# Patient Record
Sex: Female | Born: 1965 | Race: Black or African American | Hispanic: No | Marital: Married | Smoking: Never smoker
Health system: Southern US, Community
[De-identification: ages and names within clinical notes are randomized; demographics above are authoritative.]

## PROBLEM LIST (undated history)

## (undated) DIAGNOSIS — R51 Headache: Secondary | ICD-10-CM

## (undated) DIAGNOSIS — K589 Irritable bowel syndrome without diarrhea: Secondary | ICD-10-CM

## (undated) HISTORY — PX: CHOLECYSTECTOMY: SHX55

---

## 2001-02-21 ENCOUNTER — Encounter: Payer: Self-pay | Admitting: Emergency Medicine

## 2001-02-21 ENCOUNTER — Encounter (INDEPENDENT_AMBULATORY_CARE_PROVIDER_SITE_OTHER): Payer: Self-pay

## 2001-02-22 ENCOUNTER — Inpatient Hospital Stay (HOSPITAL_COMMUNITY): Admission: RE | Admit: 2001-02-22 | Discharge: 2001-02-23 | Payer: Self-pay | Admitting: Emergency Medicine

## 2002-05-15 ENCOUNTER — Other Ambulatory Visit: Admission: RE | Admit: 2002-05-15 | Discharge: 2002-05-15 | Payer: Self-pay | Admitting: Obstetrics and Gynecology

## 2003-05-02 ENCOUNTER — Other Ambulatory Visit: Admission: RE | Admit: 2003-05-02 | Discharge: 2003-05-02 | Payer: Self-pay | Admitting: Obstetrics and Gynecology

## 2003-11-26 ENCOUNTER — Ambulatory Visit (HOSPITAL_COMMUNITY): Admission: RE | Admit: 2003-11-26 | Discharge: 2003-11-26 | Payer: Self-pay | Admitting: Obstetrics and Gynecology

## 2003-11-30 ENCOUNTER — Ambulatory Visit (HOSPITAL_COMMUNITY): Admission: RE | Admit: 2003-11-30 | Discharge: 2003-11-30 | Payer: Self-pay | Admitting: Obstetrics and Gynecology

## 2003-12-03 ENCOUNTER — Ambulatory Visit (HOSPITAL_COMMUNITY): Admission: RE | Admit: 2003-12-03 | Discharge: 2003-12-03 | Payer: Self-pay | Admitting: Obstetrics and Gynecology

## 2003-12-03 ENCOUNTER — Encounter (INDEPENDENT_AMBULATORY_CARE_PROVIDER_SITE_OTHER): Payer: Self-pay | Admitting: Specialist

## 2004-04-18 ENCOUNTER — Encounter: Admission: RE | Admit: 2004-04-18 | Discharge: 2004-04-18 | Payer: Self-pay | Admitting: Gastroenterology

## 2004-05-22 ENCOUNTER — Encounter (INDEPENDENT_AMBULATORY_CARE_PROVIDER_SITE_OTHER): Payer: Self-pay | Admitting: *Deleted

## 2004-05-22 ENCOUNTER — Ambulatory Visit (HOSPITAL_COMMUNITY): Admission: RE | Admit: 2004-05-22 | Discharge: 2004-05-22 | Payer: Self-pay | Admitting: Obstetrics and Gynecology

## 2004-08-26 ENCOUNTER — Other Ambulatory Visit: Admission: RE | Admit: 2004-08-26 | Discharge: 2004-08-26 | Payer: Self-pay | Admitting: Obstetrics and Gynecology

## 2004-12-30 ENCOUNTER — Ambulatory Visit (HOSPITAL_COMMUNITY): Admission: RE | Admit: 2004-12-30 | Discharge: 2004-12-30 | Payer: Self-pay | Admitting: Obstetrics and Gynecology

## 2005-02-03 ENCOUNTER — Encounter: Admission: RE | Admit: 2005-02-03 | Discharge: 2005-04-08 | Payer: Self-pay | Admitting: Obstetrics and Gynecology

## 2005-02-12 ENCOUNTER — Ambulatory Visit (HOSPITAL_COMMUNITY): Admission: RE | Admit: 2005-02-12 | Discharge: 2005-02-12 | Payer: Self-pay | Admitting: Obstetrics and Gynecology

## 2005-04-07 ENCOUNTER — Inpatient Hospital Stay (HOSPITAL_COMMUNITY): Admission: RE | Admit: 2005-04-07 | Discharge: 2005-04-10 | Payer: Self-pay | Admitting: Obstetrics and Gynecology

## 2005-09-23 ENCOUNTER — Other Ambulatory Visit: Admission: RE | Admit: 2005-09-23 | Discharge: 2005-09-23 | Payer: Self-pay | Admitting: Obstetrics and Gynecology

## 2007-12-27 ENCOUNTER — Encounter: Admission: RE | Admit: 2007-12-27 | Discharge: 2007-12-27 | Payer: Self-pay | Admitting: Obstetrics and Gynecology

## 2009-01-03 ENCOUNTER — Encounter: Admission: RE | Admit: 2009-01-03 | Discharge: 2009-01-03 | Payer: Self-pay | Admitting: Family Medicine

## 2010-01-22 ENCOUNTER — Encounter: Admission: RE | Admit: 2010-01-22 | Discharge: 2010-01-22 | Payer: Self-pay | Admitting: Family Medicine

## 2010-08-31 ENCOUNTER — Encounter: Payer: Self-pay | Admitting: Obstetrics and Gynecology

## 2010-10-31 ENCOUNTER — Other Ambulatory Visit: Payer: Self-pay | Admitting: Gastroenterology

## 2010-10-31 DIAGNOSIS — R634 Abnormal weight loss: Secondary | ICD-10-CM

## 2010-10-31 DIAGNOSIS — R197 Diarrhea, unspecified: Secondary | ICD-10-CM

## 2010-11-04 ENCOUNTER — Ambulatory Visit
Admission: RE | Admit: 2010-11-04 | Discharge: 2010-11-04 | Disposition: A | Discharge status: Home / Self Care | Payer: BC Managed Care – PPO | Source: Ambulatory Visit | Attending: Gastroenterology | Admitting: Gastroenterology

## 2010-11-04 DIAGNOSIS — R634 Abnormal weight loss: Secondary | ICD-10-CM

## 2010-11-04 DIAGNOSIS — R197 Diarrhea, unspecified: Secondary | ICD-10-CM

## 2010-11-04 MED ORDER — IOHEXOL 300 MG/ML  SOLN
125.0000 mL | Freq: Once | INTRAMUSCULAR | Status: AC | PRN
  Administered 2010-11-04: 125 mL via INTRAVENOUS

## 2010-12-26 NOTE — Op Note (Signed)
[name], BLAKLEY NO.:  [id number]   MEDICAL RECORD NO.:  [id number]                   PATIENT TYPE:  AMB   LOCATION:  SDC                                  [facility]ILITY:  [facility]   PHYSICIAN:  Janine Limbo, M.D.            DATE OF BIRTH:  05-22-1966   DATE OF PROCEDURE:  12/03/2003  DATE OF DISCHARGE:                                 OPERATIVE REPORT   PREOPERATIVE DIAGNOSIS:  First trimester missed abortion.   POSTOPERATIVE DIAGNOSIS:  First trimester missed abortion.   PROCEDURE:  Suction dilatation and evacuation.   SURGEON:  Janine Limbo, M.D.   ANESTHESIA:  Monitored anesthetic control and paracervical block.   DISPOSITION:  [name] is a 45 year old who presents with a missed  abortion in the first trimester, confirmed by ultrasound.  She understands  the indications for her procedure and she accepts the risks of, but not  limited to, anesthetic complications, bleeding, infections, and possible  damage to the surrounding organs.   FINDINGS:  A moderate amount of products of conception were removed from  within the uterine cavity.  The uterus sounded to 11 cm.  The patient's  blood type is B positive.   PROCEDURE:  The patient was taken to the operating room, where she was given  medication through her IV line.  The perineum and vagina were prepped with  multiple layers of Betadine.  The bladder was drained of urine.  The patient  was then sterilely draped after examination under anesthesia.  A  paracervical block was placed using 10 mL of 0.5% Marcaine with epinephrine.  The uterus was sounded to 11 cm.  The cervix was gradually dilated.  The  uterine cavity was then evacuated using a medium sharp curette and a #8  suction curette.  The cavity was felt to be clean at the end of our  procedure.  Hemostasis was adequate.  Examination under anesthesia was  repeated, and the uterus was noted to be firm.  Sponge, needle, and  instrument counts were correct.  The estimated blood loss was 25 mL.  The  patient tolerated her procedure well.  She was taken to the recovery room in  stable condition.   FOLLOW-UP INSTRUCTIONS:  The patient will take ibuprofen 600 mg every six  hours as needed for mild to moderate pain.  She will take Vicodin one to two  tablets every four hours as needed for severe pain.  She will return to see  Dr. Stefano Gaul in two to three weeks for follow-up examination.  She will  return to work in at least one week.  The patient was given a copy of the  postoperative instruction sheet as prepared by the [facility]  [facility] for patients who have undergone a dilatation and curettage.  Janine Limbo, M.D.    AVS/MEDQ  D:  12/03/2003  T:  12/03/2003  Job:  [id number]

## 2010-12-26 NOTE — Op Note (Signed)
[name], [name] NO.:  [id number]   MEDICAL RECORD NO.:  [id number]          PATIENT TYPE:  AMB   LOCATION:  SDC                           [facility]   PHYSICIAN:  Janine Limbo, M.D.DATE OF BIRTH:  May 27, 1966   DATE OF PROCEDURE:  05/22/2004  DATE OF DISCHARGE:                                 OPERATIVE REPORT   PREOPERATIVE DIAGNOSES:  1.  Pelvic pain.  2.  Diarrhea at the time of menses.  3.  Obesity (weight is 286 pounds, height 5 feet 7 inches).   POSTOPERATIVE DIAGNOSES:  1.  Pelvic pain.  2.  Diarrhea at the time of menses.  3.  Obesity (weight is 286 pounds, height 5 feet 7 inches).  4.  Fibroid uterus.  5.  Rule out endometriosis.   PROCEDURE:  1.  Diagnostic laparoscopy.  2.  Laparoscopic pelvic biopsies.   SURGEON:  Janine Limbo, M.D.   FIRST ASSISTANT:  None.   ANESTHESIA:  General.   DISPOSITION:  [name] is a 45 year old female, para 1-0-1-1, who presents  with the above mentioned diagnosis. She reports that her abdominal pain,  pelvic pain, and diarrhea are worse just prior to her menses. The patient  understands the indications for her procedure and she accepts the risk of,  but not limited, anesthetic complications, bleeding, infections and possible  damage to the surrounding organs.   FINDINGS:  The patient was found to have a 12 week size multifibroid uterus.  The fallopian tubes and the ovaries appeared normal bilaterally. There was  an area of hyperpigmentation and scarring in the left posterior cul-de-sac.  This was thought to be consistent with endometriosis. The remainder of the  cul-de-sac appeared normal. The appendix and the bowel appeared normal. We  were unable to visualize the upper abdomen because of scarring with the  omentum. The patient has previously had a cholecystectomy.   DESCRIPTION OF PROCEDURE:  The patient was taken to the operating room where  a general anesthetic was given. The  patient's abdomen, perineum and vagina  were prepped with multiple layers of Betadine. A Foley catheter was placed  in the bladder. Examination under anesthesia was performed. A Hulka  tenaculum was placed inside the uterus. The patient was sterilely draped.  The subumbilical area was injected with 5 mL of 0.5%  Marcaine with  epinephrine. An incision was made in the subumbilical area and extended  through the subcutaneous tissue, the fascia, and the anterior peritoneum.  Dissection was made difficult because of the patient's obesity. The Hasson  cannula was sutured into place using #0 Vicryl.  The patient was noted to  have omentum that was adhered to the anterior abdominal wall. Visualization  of the pelvis was very difficult because of an excessive amount of adipose  tissue in the pelvis. Two small incisions were made to the right of the  midline in the suprapubic area and two 5 mm trocars were placed under direct  visualization. The pelvic structures were carefully inspected. Findings are  mentioned above. There was an area of hyperpigmentation and scarring  in the  left posterior cul-de-sac and this area was biopsied away. Care was taken  not to damage any of the vital underlying structures.  Hemostasis was noted  to be adequate. There were no other areas felt to be consistent with  endometriosis visualized. The decision was then made to end the procedure.  The pneumoperitoneum was allowed to escape. Again there was no evidence of  bleeding or damage the bowel or vital structures. The fascia at the  subumbilical incision was then closed using figure-of-eight sutures of #0  Vicryl. The subcutaneous layer was closed using interrupted sutures. The  skin was reapproximated using a subcuticular suture of 4-0 Vicryl. The  suprapubic incisions were closed using subcuticular stitches of 4-0 Vicryl.  The patient tolerated the procedure well. She was awakened from her  anesthetic and taken to the  recovery room in stable condition. All  instruments were removed. Sponge, needle and instrument counts were correct.  The estimated blood loss was 10 mL.   FOLLOW UP:  The patient was given a prescription for Vicodin and she will  take 1-2 tablets every four hours as needed for pain. She will return to see  Dr. Stefano Gaul in 2-3 weeks for followup examination. She was given a copy of  the postoperative instruction sheet as prepared by the [facility]  [facility] for patients who have undergone laparoscopy.      AVS/MEDQ  D:  05/22/2004  T:  05/22/2004  Job:  [id number]   cc:   Petra Kuba, M.D.  [address]  [place]  [place] [address]  Fax: [phone]

## 2010-12-26 NOTE — H&P (Signed)
NAMEMELLISA, Bowen NO.:  [id number]   MEDICAL RECORD NO.:  [id number]          PATIENT TYPE:  AMB   LOCATION:  SDC                           [facility]   PHYSICIAN:  Janine Limbo, M.D.DATE OF BIRTH:  [birth year]   DATE OF ADMISSION:  DATE OF DISCHARGE:                                HISTORY & PHYSICAL   DATE OF SURGERY:  May 22, 2004   HISTORY OF PRESENT ILLNESS:  Ms. Bowen is a 45 year old female para 1-0-1-1  who presents for a diagnostic laparoscopy because of abdominal pain and  diarrhea.  She notes that this is particularly at the time of her menses.  An ultrasound was obtained that showed a 9.4 x 5.8 cm uterus.  The patient  was found to have multiple fibroids with the largest of which measuring 3.4  cm in size.  The endometrial thickness was 0.4 cm.  The ovaries appeared  normal.  The patient has had a negative urine culture.  She has had a  negative gonorrhea and negative chlamydia culture.  The patient's most  recent Pap smear was within normal limits.  The patient has had a previous  cholecystectomy.  She has been evaluated by a gastroenterologist and no  etiology has been found.   PAST MEDICAL HISTORY:  The patient had a cholecystectomy in 2000.  She had a  dilatation and curettage for a miscarriage in 2005.   DRUG ALLERGIES:  None known.   CURRENT MEDICATIONS:  Prenatal vitamins.   OBSTETRICAL HISTORY:  The patient has had a term vaginal delivery and one  first trimester miscarriage.   SOCIAL HISTORY:  The patient drinks alcohol socially.  She denies cigarette  use.  She denies recreational drug use.   FAMILY HISTORY:  Noncontributory.   REVIEW OF SYSTEMS:  See history of present illness.   PHYSICAL EXAMINATION:  VITAL SIGNS:  Weight is 286 pounds, height is 5 feet  7 inches.  HEENT:  Within normal limits.  CHEST:  Clear.  HEART:  Regular rate and rhythm.  BREASTS:  Without masses.  ABDOMEN:  Nontender and no masses are  appreciated.  EXTREMITIES:  Grossly normal.  NEUROLOGIC:  Grossly normal.  PELVIC:  External genitalia is normal, vagina is normal.  Cervix is  nontender.  Uterus is normal size, shape, and consistency.  Adnexa no  masses.   ASSESSMENT:  Abdominal pain with diarrhea, particularly at the time of  menses.  We must rule out endometriosis.   PLAN:  The patient will undergo a diagnostic laparoscopy.  She understands  the indications for her procedure and she accepts the risks of, but not  limited to, anesthetic complications, bleeding, infections, and possible  damage to the surrounding organs.      AVS/MEDQ  D:  05/16/2004  T:  05/16/2004  Job:  [id number]   cc:   Petra Kuba, M.D.  [address], [address]  [place]  [place] [address]  Fax: [phone]

## 2010-12-26 NOTE — Op Note (Signed)
[facility]  Patient:    [name], [name]                        MRN: [id number] Proc. Date: 02/21/01 Adm. Date:  [id number] Attending:  Vikki Ports.                           Operative Report  NO DICTATION DD:  02/21/01 TD:  02/22/01 Job: [id number]

## 2010-12-26 NOTE — Op Note (Signed)
[facility]  Patient:    [name], [name]                        MRN: [id number] Proc. Date: 02/21/01 Adm. Date:  [id number] Attending:  Vikki Ports CC:         Harrel Lemon. Merla Riches, M.D.   Operative Report  PREOPERATIVE DIAGNOSIS:  Acute cholelithiasis.  POSTOPERATIVE DIAGNOSIS:  Acute cholelithiasis.  OPERATION:  Laparoscopic cholecystectomy.  SURGEON:  Catalina Lunger, M.D.  ASSISTANT:  Sheppard Plumber. Earlene Plater, M.D.  ANESTHESIA:  General.  DESCRIPTION OF PROCEDURE:  The patient was taken to the operating room and placed in a supine position.  After adequate anesthesia was induced using endotracheal tube, the abdomen was prepped and draped in normal sterile fashion.  Using a transverse infraumbilical incision, I dissected down to the fascia.  The patient was quite obese, and it was a deep dissection.  The fascia was opened vertically.   The peritoneum was entered, and an 0 Vicryl pursestring suture was placed around the fascial defect.  Hasson trocar was placed in the abdomen, and the abdomen was insufflated to 15 mmHg using carbon dioxide.  Under direct visualization, a 10 mm port was placed in the subxiphoid region, and two 5 mm ports were placed in the right abdomen. Gallbladder was visualized.  It was very, very tense and, therefore, was aspirated using a ______ aspirator.  Dissection then began down at the fundus of the gallbladder until the cystic duct was easily identified.  It measured about 1.5 to 2 mm, was triply clipped and divided.  The cystic artery, however, was more complex.  There appeared to be an anterior, posterior, and a third branch.  These were dissected until they were seen to go clearly onto the gallbladder.  These were clipped individually and divided.  The gallbladder was removed off the gallbladder bed bed using Bovie electrocautery and removed through the umbilical port.  A second 0 Vicryl  figure-of-eight suture was used to close the fascial defect as there was an air leak.  The right upper quadrant was copiously irrigated.  Adequate hemostasis was ensured.  All incisions were injected using Marcaine, and the skin incisions were closed with subcuticular 4-0  Monocryl.  Steri-Strips and sterile dressings were applied.  The patient tolerated the procedure well and went to PACU in good condition. DD:  02/22/01 TD:  02/22/01 Job: [id number]

## 2010-12-26 NOTE — H&P (Signed)
[name], [name] NO.:  [id number]   MEDICAL RECORD NO.:  [id number]          PATIENT TYPE:  MAT   LOCATION:  MATC                          [facility]   PHYSICIAN:  Janine Limbo, M.D.DATE OF BIRTH:  [birth year]   DATE OF ADMISSION:  04/07/2005  DATE OF DISCHARGE:                                HISTORY & PHYSICAL   HISTORY OF PRESENT ILLNESS:  [name] is a 45 year old female, gravida 3,  para 1-0-1-1, who presents at [redacted] weeks gestation ([facility] April 06, 2005). The  patient has been followed at the [facility] division of  [facility]. This pregnancy has been complicated by  gestational diabetes. The patient's blood sugars have been well controlled  by diet and exercise. The patient also has a known history of large  fibroids. Her age is greater than 35 and she declined amniocentesis. The  patient is obese and her weight is 287 pounds.   OBSTETRICAL HISTORY:  In 1997, the patient had a vaginal delivery at term of  a 7 pound and 14 ounce female infant. The patient had a miscarriage in 2005  and had a dilatation and curettage.   ALLERGIES:  No known drug allergies.   PAST MEDICAL HISTORY:  The patient is obese as mentioned above. She has a  known history of fibroids. In addition, the patient was diagnosed with  endometriosis.   SOCIAL HISTORY:  The patient denied cigarette use, alcohol use, and  recreational drug use.   REVIEW OF SYSTEMS:  Normal pregnancy complaints.   FAMILY HISTORY:  Noncontributory.   PHYSICAL EXAMINATION:  VITAL SIGNS:  Weight is 287 pounds. Height is 5 feet  and 6 inches.  HEENT:  Within normal limits.  CHEST:  Clear.  HEART:  Regular rate and rhythm.  BREASTS:  Without masses.  ABDOMEN:  Gravid with a fundal height of 41 cm.  EXTREMITIES:  Within normal limits.  NEUROLOGICAL:  Grossly normal.  PELVIC:  Cervix is 3 cm dilated, 50% effaced and -3 in station.   LABORATORY DATA:  Blood type  is B positive. Antibody screen negative. Sickle  cell negative. VDRL nonreactive. Rubella positive. HBSAG negative. HIV  negative. Third trimester beta strep is negative. Third trimester gonorrhea  is negative. Third trimester Chlamydia is negative.   ASSESSMENT:  1.  Gestation 40 weeks.  2.  Gestational diabetes, well controlled with diet.  3.  Obesity.  4.  Age greater than 35.  5.  Large fibroid uterus.   PLAN:  The patient will be admitted to the hospital for induction of labor.      Janine Limbo, M.D.  Electronically Signed     AVS/MEDQ  D:  04/06/2005  T:  04/06/2005  Job:  [id number]

## 2010-12-26 NOTE — H&P (Signed)
NAME:  [name], CAPPELLI NO.:  [id number]   MEDICAL RECORD NO.:  [id number]                   PATIENT TYPE:  OUT   LOCATION:  ULT                                  [facility]   PHYSICIAN:  Janine Limbo, M.D.            DATE OF BIRTH:  Aug 21, 1965   DATE OF ADMISSION:  DATE OF DISCHARGE:                                HISTORY & PHYSICAL   HISTORY OF PRESENT ILLNESS:  [name] is a 45 year old female, who  presents with a first trimester miscarriage.  This was confirmed by serial  ultrasounds.  The patient also was found to have a positive __________titer  with IgM antibodies but no IgG antibodies  .She has had cramping and  bleeding she says for approximately 11 days.   PAST MEDICAL HISTORY:  1. The patient has been told that she had borderline diabetes.  2. She has had one of her wisdom teeth removed.  3. She also had a gallbladder removed in 2001.   ALLERGIES:  Drug allergies:  None known.   SOCIAL HISTORY:  The patient denies cigarette use, alcohol use, and  recreational drug use.   REVIEW OF SYSTEMS:  Noncontributory.   FAMILY HISTORY:  The patient's grandmother has hypertension and colon  cancer.   PHYSICAL EXAMINATION:  HEENT:  Within normal limits.  CHEST:  Clear.  HEART:  Regular rate and rhythm.  ABDOMEN:  Nontender.  EXTREMITIES:  Grossly normal.  NEUROLOGIC:  Grossly normal.  PELVIC:  External genitalia are normal.  Vagina is normal.  Cervix is  nontender.  Uterus is upper limits of normal size.  Adnexa:  No masses.   LABORATORY DATA:  Blood  type is B positive, RPR nonreactive, rubella  positive.  Antibody screen negative, sickle cell screen negative.  HIV  nonreactive.   ASSESSMENT:  First trimester miscarriage.   PLAN:  The patient will undergo a dilatation and evacuation.  She  understands the indications for her procedure and she accepts the risks of,  but not limited to, anesthetic complications, bleeding,  infections, and  possible damage to the surrounding organs.                                              Janine Limbo, M.D.   AVS/MEDQ  D:  12/02/2003  T:  12/02/2003  Job:  [id number]

## 2011-01-20 ENCOUNTER — Other Ambulatory Visit: Payer: Self-pay | Admitting: Family Medicine

## 2011-01-20 DIAGNOSIS — Z1231 Encounter for screening mammogram for malignant neoplasm of breast: Secondary | ICD-10-CM

## 2011-02-18 ENCOUNTER — Ambulatory Visit
Admission: RE | Admit: 2011-02-18 | Discharge: 2011-02-18 | Disposition: A | Discharge status: Home / Self Care | Payer: BC Managed Care – PPO | Source: Ambulatory Visit | Attending: Family Medicine | Admitting: Family Medicine

## 2011-02-18 DIAGNOSIS — Z1231 Encounter for screening mammogram for malignant neoplasm of breast: Secondary | ICD-10-CM

## 2011-07-10 ENCOUNTER — Other Ambulatory Visit: Payer: Self-pay | Admitting: Obstetrics and Gynecology

## 2011-07-14 ENCOUNTER — Encounter (HOSPITAL_COMMUNITY): Payer: Self-pay | Admitting: Pharmacist

## 2011-07-17 ENCOUNTER — Encounter (HOSPITAL_COMMUNITY): Payer: Self-pay

## 2011-07-17 ENCOUNTER — Encounter (HOSPITAL_COMMUNITY)
Admission: RE | Admit: 2011-07-17 | Discharge: 2011-07-17 | Disposition: A | Discharge status: Home / Self Care | Payer: BC Managed Care – PPO | Source: Ambulatory Visit | Attending: Obstetrics and Gynecology | Admitting: Obstetrics and Gynecology

## 2011-07-17 HISTORY — DX: Headache: R51

## 2011-07-17 HISTORY — DX: Irritable bowel syndrome, unspecified: K58.9

## 2011-07-17 LAB — BASIC METABOLIC PANEL
BUN: 12 mg/dL (ref 6–23)
CO2: 26 mEq/L (ref 19–32)
Glucose, Bld: 90 mg/dL (ref 70–99)
Potassium: 3.4 mEq/L — ABNORMAL LOW (ref 3.5–5.1)
Sodium: 138 mEq/L (ref 135–145)

## 2011-07-17 LAB — URINALYSIS, ROUTINE W REFLEX MICROSCOPIC
Leukocytes, UA: NEGATIVE
Specific Gravity, Urine: >1.03 — ABNORMAL HIGH (ref 1.005–1.030)
Urobilinogen, UA: 0.2 mg/dL (ref 0.0–1.0)
pH: 5.5 (ref 5.0–8.0)

## 2011-07-17 LAB — CBC
Hemoglobin: 12.8 g/dL (ref 12.0–15.0)
MCH: 29.2 pg (ref 26.0–34.0)
MCV: 88.6 fL (ref 78.0–100.0)
RBC: 4.38 MIL/uL (ref 3.87–5.11)

## 2011-07-17 LAB — SURGICAL PCR SCREEN: MRSA, PCR: NEGATIVE

## 2011-07-17 LAB — URINE MICROSCOPIC-ADD ON

## 2011-07-17 NOTE — Pre-Procedure Instructions (Signed)
Pt's home #- [id number]

## 2011-07-17 NOTE — Patient Instructions (Addendum)
YOUR PROCEDURE IS SCHEDULED ON:07/23/11  ENTER THROUGH THE MAIN ENTRANCE OF [facility] [id number] am USE DESK PHONE AND DIAL [id number] TO INFORM us OF YOUR ARRIVAL  CALL [phone] IF YOU HAVE ANY QUESTIONS OR PROBLEMS PRIOR TO YOUR ARRIVAL.  REMEMBER: DO NOT EAT OR DRINK AFTER MIDNIGHT : Wed night  SPECIAL INSTRUCTIONS:   YOU MAY BRUSH YOUR TEETH THE MORNING OF SURGERY   TAKE THESE MEDICINES THE DAY OF SURGERY WITH SIP OF WATER:none   DO NOT WEAR JEWELRY, EYE MAKEUP, LIPSTICK OR DARK FINGERNAIL POLISH DO NOT WEAR LOTIONS    YOU WILL NOT BE ALLOWED TO DRIVE YOURSELF HOME.  NAME OF DRIVER:spouse- Oswaldo Done

## 2011-07-22 MED ORDER — CEFAZOLIN SODIUM-DEXTROSE 2-3 GM-% IV SOLR
2.0000 g | INTRAVENOUS | Status: AC
  Administered 2011-07-23: 2 g via INTRAVENOUS
  Filled 2011-07-22: qty 50

## 2011-07-22 NOTE — H&P (Signed)
[name], [name] NO.:  [id number]  MEDICAL RECORD NO.:  [id number]  LOCATION:  PERIO                         [facility]ILITY:  [facility]  PHYSICIAN:  Janine Limbo, M.D.DATE OF BIRTH:  July 10, 1966  DATE OF ADMISSION:  07/09/2011 DATE OF DISCHARGE:  07/17/2011                             HISTORY & PHYSICAL   HISTORY OF PRESENT ILLNESS:  [name] is a 45 year old female, para 2- 0-1-2, who presents for a vaginal hysterectomy.  The patient has been followed at the [facility].  The patient complains of menorrhagia. The patient has been treated with a Mirena intrauterine device and this was not able to control her bleeding.  She also complains of dysmenorrhea.  An ultrasound was performed, which showed a 10.74 x 8.02 cm uterus.  Fibroids were noted with the largest fibroid measuring 4.76 cm.  DRUG ALLERGIES:  No known drug allergies.  PAST MEDICAL HISTORY:  The patient has a history of gestational diabetes.  She also has a history of obesity.  OBSTETRICAL HISTORY:  The patient has had 2 vaginal deliveries at term. She had 1 miscarriage in the first trimester followed by dilatation and curettage.  SOCIAL HISTORY:  The patient denies cigarette use, alcohol use, and recreational drug use.  REVIEW OF SYSTEMS:  The patient complains of fatigue and back pain that she believes is due to her fibroids.  FAMILY HISTORY:  Noncontributory.  PHYSICAL EXAMINATION:  VITAL SIGNS:  Height is 5 feet 6 inches, weight is 293 pounds. HEENT:  Within normal limits. CHEST:  Clear. HEART:  Regular rate and rhythm. BREASTS:  Her breasts are without masses. ABDOMEN:  Nontender. EXTREMITIES:  Grossly normal. NEUROLOGIC:  Grossly normal. PELVIC:  External genitalia is normal.  Vagina is normal.  Cervix is nontender.  The uterus is approximately 8-10 week size, but it is difficult to outline the uterus due to  the patient's obese abdomen. Adnexa, no masses are appreciated and rectovaginal exam confirms.  ASSESSMENT: 1. Fibroid uterus. 2. Menorrhagia. 3. Dysmenorrhea. 4. Obesity.  PLAN:  The patient will undergo a vaginal hysterectomy.  She understands the indications for surgical procedure and she accepts the risks of, but not limited to, anesthetic complications, bleeding, infections, and possible damage to the surrounding organs.     Janine Limbo, M.D.     AVS/MEDQ  D:  07/22/2011  T:  07/22/2011  Job:  [id number]

## 2011-07-23 ENCOUNTER — Encounter (HOSPITAL_COMMUNITY)
Admission: RE | Disposition: A | Discharge status: Home / Self Care | Payer: Self-pay | Source: Ambulatory Visit | Attending: Obstetrics and Gynecology

## 2011-07-23 ENCOUNTER — Ambulatory Visit (HOSPITAL_COMMUNITY): Payer: BC Managed Care – PPO | Admitting: Anesthesiology

## 2011-07-23 ENCOUNTER — Other Ambulatory Visit: Payer: Self-pay | Admitting: Obstetrics and Gynecology

## 2011-07-23 ENCOUNTER — Ambulatory Visit (HOSPITAL_COMMUNITY)
Admission: RE | Admit: 2011-07-23 | Discharge: 2011-07-24 | Disposition: A | Discharge status: Home / Self Care | Payer: BC Managed Care – PPO | Source: Ambulatory Visit | Attending: Obstetrics and Gynecology | Admitting: Obstetrics and Gynecology

## 2011-07-23 ENCOUNTER — Encounter (HOSPITAL_COMMUNITY): Payer: Self-pay | Admitting: *Deleted

## 2011-07-23 ENCOUNTER — Encounter (HOSPITAL_COMMUNITY): Payer: Self-pay | Admitting: Anesthesiology

## 2011-07-23 DIAGNOSIS — Z01818 Encounter for other preprocedural examination: Secondary | ICD-10-CM | POA: Insufficient documentation

## 2011-07-23 DIAGNOSIS — E669 Obesity, unspecified: Secondary | ICD-10-CM | POA: Insufficient documentation

## 2011-07-23 DIAGNOSIS — N946 Dysmenorrhea, unspecified: Secondary | ICD-10-CM | POA: Insufficient documentation

## 2011-07-23 DIAGNOSIS — Z01812 Encounter for preprocedural laboratory examination: Secondary | ICD-10-CM | POA: Insufficient documentation

## 2011-07-23 DIAGNOSIS — N920 Excessive and frequent menstruation with regular cycle: Secondary | ICD-10-CM | POA: Insufficient documentation

## 2011-07-23 DIAGNOSIS — D252 Subserosal leiomyoma of uterus: Secondary | ICD-10-CM | POA: Insufficient documentation

## 2011-07-23 DIAGNOSIS — N719 Inflammatory disease of uterus, unspecified: Secondary | ICD-10-CM

## 2011-07-23 DIAGNOSIS — D219 Benign neoplasm of connective and other soft tissue, unspecified: Secondary | ICD-10-CM

## 2011-07-23 DIAGNOSIS — D251 Intramural leiomyoma of uterus: Secondary | ICD-10-CM | POA: Insufficient documentation

## 2011-07-23 DIAGNOSIS — D250 Submucous leiomyoma of uterus: Secondary | ICD-10-CM | POA: Insufficient documentation

## 2011-07-23 DIAGNOSIS — D649 Anemia, unspecified: Secondary | ICD-10-CM | POA: Insufficient documentation

## 2011-07-23 HISTORY — PX: VAGINAL HYSTERECTOMY: SHX2639

## 2011-07-23 SURGERY — HYSTERECTOMY, VAGINAL
Anesthesia: General

## 2011-07-23 MED ORDER — GLYCOPYRROLATE 0.2 MG/ML IJ SOLN
INTRAMUSCULAR | Status: DC | PRN
  Administered 2011-07-23: .6 mg via INTRAVENOUS
  Administered 2011-07-23: 0.2 mg via INTRAVENOUS

## 2011-07-23 MED ORDER — HYDROMORPHONE HCL PF 1 MG/ML IJ SOLN
INTRAMUSCULAR | Status: AC
  Filled 2011-07-23: qty 1

## 2011-07-23 MED ORDER — KETOROLAC TROMETHAMINE 30 MG/ML IJ SOLN
INTRAMUSCULAR | Status: DC | PRN
  Administered 2011-07-23 (×2): 30 mg via INTRAMUSCULAR

## 2011-07-23 MED ORDER — IBUPROFEN 800 MG PO TABS
800.0000 mg | ORAL_TABLET | Freq: Three times a day (TID) | ORAL | Status: DC | PRN
  Administered 2011-07-24: 800 mg via ORAL
  Filled 2011-07-23: qty 1

## 2011-07-23 MED ORDER — ONDANSETRON HCL 4 MG/2ML IJ SOLN
INTRAMUSCULAR | Status: DC | PRN
  Administered 2011-07-23: 4 mg via INTRAVENOUS

## 2011-07-23 MED ORDER — PROPOFOL 10 MG/ML IV EMUL
INTRAVENOUS | Status: DC | PRN
  Administered 2011-07-23: 200 mg via INTRAVENOUS

## 2011-07-23 MED ORDER — MEPERIDINE HCL 25 MG/ML IJ SOLN: 6.2500 mg | INTRAMUSCULAR | Status: DC | PRN

## 2011-07-23 MED ORDER — ROCURONIUM BROMIDE 50 MG/5ML IV SOLN
INTRAVENOUS | Status: AC
  Filled 2011-07-23: qty 1

## 2011-07-23 MED ORDER — MIDAZOLAM HCL 2 MG/2ML IJ SOLN
INTRAMUSCULAR | Status: AC
  Filled 2011-07-23: qty 2

## 2011-07-23 MED ORDER — LIDOCAINE HCL (CARDIAC) 20 MG/ML IV SOLN
INTRAVENOUS | Status: AC
  Filled 2011-07-23: qty 5

## 2011-07-23 MED ORDER — NEOSTIGMINE METHYLSULFATE 1 MG/ML IJ SOLN
INTRAMUSCULAR | Status: DC | PRN
  Administered 2011-07-23: 4 mg via INTRAVENOUS

## 2011-07-23 MED ORDER — BUPIVACAINE-EPINEPHRINE 0.5% -1:200000 IJ SOLN
INTRAMUSCULAR | Status: DC | PRN
  Administered 2011-07-23: 20 mL

## 2011-07-23 MED ORDER — LACTATED RINGERS IV SOLN
INTRAVENOUS | Status: DC
  Administered 2011-07-23: 11:00:00 via INTRAVENOUS
  Administered 2011-07-23: 1000 mL via INTRAVENOUS
  Administered 2011-07-23: 12:00:00 via INTRAVENOUS

## 2011-07-23 MED ORDER — GLYCOPYRROLATE 0.2 MG/ML IJ SOLN
INTRAMUSCULAR | Status: AC
  Filled 2011-07-23: qty 1

## 2011-07-23 MED ORDER — MORPHINE SULFATE 10 MG/ML IJ SOLN
INTRAMUSCULAR | Status: AC
  Filled 2011-07-23: qty 1

## 2011-07-23 MED ORDER — HYOSCYAMINE SULFATE ER 0.375 MG PO TB12
0.3750 mg | ORAL_TABLET | Freq: Two times a day (BID) | ORAL | Status: DC | PRN
  Filled 2011-07-23: qty 1

## 2011-07-23 MED ORDER — DEXTROSE IN LACTATED RINGERS 5 % IV SOLN
INTRAVENOUS | Status: DC
  Administered 2011-07-23: 19:00:00 via INTRAVENOUS

## 2011-07-23 MED ORDER — FENTANYL CITRATE 0.05 MG/ML IJ SOLN
INTRAMUSCULAR | Status: DC | PRN
  Administered 2011-07-23 (×3): 50 ug via INTRAVENOUS
  Administered 2011-07-23: 100 ug via INTRAVENOUS

## 2011-07-23 MED ORDER — FENTANYL CITRATE 0.05 MG/ML IJ SOLN
INTRAMUSCULAR | Status: AC
  Filled 2011-07-23: qty 2

## 2011-07-23 MED ORDER — HYDROMORPHONE HCL PF 1 MG/ML IJ SOLN
INTRAMUSCULAR | Status: AC
  Administered 2011-07-23: 0.5 mg via INTRAVENOUS
  Filled 2011-07-23: qty 1

## 2011-07-23 MED ORDER — HYDROMORPHONE HCL PF 1 MG/ML IJ SOLN
0.2500 mg | INTRAMUSCULAR | Status: DC | PRN
  Administered 2011-07-23 (×3): 0.5 mg via INTRAVENOUS

## 2011-07-23 MED ORDER — DOXYCYCLINE HYCLATE 100 MG PO TABS
100.0000 mg | ORAL_TABLET | Freq: Two times a day (BID) | ORAL | Status: DC
  Administered 2011-07-23 – 2011-07-24 (×2): 100 mg via ORAL
  Filled 2011-07-23 (×4): qty 1

## 2011-07-23 MED ORDER — MIDAZOLAM HCL 5 MG/5ML IJ SOLN
INTRAMUSCULAR | Status: DC | PRN
  Administered 2011-07-23: 1 mg via INTRAVENOUS
  Administered 2011-07-23: 2 mg via INTRAVENOUS

## 2011-07-23 MED ORDER — LIDOCAINE HCL (CARDIAC) 20 MG/ML IV SOLN
INTRAVENOUS | Status: DC | PRN
  Administered 2011-07-23: 100 mg via INTRAVENOUS

## 2011-07-23 MED ORDER — HYDROMORPHONE HCL PF 1 MG/ML IJ SOLN
0.2000 mg | INTRAMUSCULAR | Status: DC | PRN
  Administered 2011-07-23: 1 mg via INTRAVENOUS
  Filled 2011-07-23: qty 1

## 2011-07-23 MED ORDER — ONDANSETRON HCL 4 MG PO TABS: 4.0000 mg | ORAL_TABLET | Freq: Four times a day (QID) | ORAL | Status: DC | PRN

## 2011-07-23 MED ORDER — ONDANSETRON HCL 4 MG/2ML IJ SOLN
INTRAMUSCULAR | Status: AC
  Filled 2011-07-23: qty 2

## 2011-07-23 MED ORDER — CEFAZOLIN SODIUM-DEXTROSE 2-3 GM-% IV SOLR
2.0000 g | Freq: Three times a day (TID) | INTRAVENOUS | Status: AC
  Administered 2011-07-23 – 2011-07-24 (×2): 2 g via INTRAVENOUS
  Filled 2011-07-23 (×2): qty 50

## 2011-07-23 MED ORDER — METOCLOPRAMIDE HCL 5 MG/ML IJ SOLN: 10.0000 mg | Freq: Once | INTRAMUSCULAR | Status: DC | PRN

## 2011-07-23 MED ORDER — MENTHOL 3 MG MT LOZG
1.0000 | LOZENGE | OROMUCOSAL | Status: DC | PRN
  Filled 2011-07-23: qty 9

## 2011-07-23 MED ORDER — DEXAMETHASONE SODIUM PHOSPHATE 10 MG/ML IJ SOLN
INTRAMUSCULAR | Status: DC | PRN
  Administered 2011-07-23: 10 mg via INTRAVENOUS

## 2011-07-23 MED ORDER — KETOROLAC TROMETHAMINE 30 MG/ML IJ SOLN
30.0000 mg | Freq: Four times a day (QID) | INTRAMUSCULAR | Status: DC
  Administered 2011-07-23 – 2011-07-24 (×2): 30 mg via INTRAVENOUS
  Filled 2011-07-23 (×2): qty 1

## 2011-07-23 MED ORDER — OXYCODONE-ACETAMINOPHEN 5-325 MG PO TABS
1.0000 | ORAL_TABLET | ORAL | Status: DC | PRN
  Administered 2011-07-24: 2 via ORAL
  Filled 2011-07-23: qty 2

## 2011-07-23 MED ORDER — DEXAMETHASONE SODIUM PHOSPHATE 10 MG/ML IJ SOLN
INTRAMUSCULAR | Status: AC
  Filled 2011-07-23: qty 1

## 2011-07-23 MED ORDER — MORPHINE SULFATE 10 MG/ML IJ SOLN
INTRAMUSCULAR | Status: DC | PRN
  Administered 2011-07-23 (×2): 2 mg via INTRAVENOUS

## 2011-07-23 MED ORDER — ONDANSETRON HCL 4 MG/2ML IJ SOLN: 4.0000 mg | Freq: Four times a day (QID) | INTRAMUSCULAR | Status: DC | PRN

## 2011-07-23 MED ORDER — NEOSTIGMINE METHYLSULFATE 1 MG/ML IJ SOLN
INTRAMUSCULAR | Status: AC
  Filled 2011-07-23: qty 10

## 2011-07-23 MED ORDER — ROCURONIUM BROMIDE 100 MG/10ML IV SOLN
INTRAVENOUS | Status: DC | PRN
  Administered 2011-07-23: 50 mg via INTRAVENOUS
  Administered 2011-07-23: 20 mg via INTRAVENOUS

## 2011-07-23 SURGICAL SUPPLY — 27 items
CANISTER SUCTION 2500CC (MISCELLANEOUS) ×2 IMPLANT
CLOTH BEACON ORANGE TIMEOUT ST (SAFETY) ×2 IMPLANT
CONT PATH 16OZ SNAP LID 3702 (MISCELLANEOUS) IMPLANT
DECANTER SPIKE VIAL GLASS SM (MISCELLANEOUS) IMPLANT
DRAPE PROXIMA HALF (DRAPES) ×2 IMPLANT
GLOVE BIOGEL PI IND STRL 6.5 (GLOVE) ×1 IMPLANT
GLOVE BIOGEL PI IND STRL 8.5 (GLOVE) ×1 IMPLANT
GLOVE BIOGEL PI INDICATOR 6.5 (GLOVE) ×1
GLOVE BIOGEL PI INDICATOR 8.5 (GLOVE) ×1
GLOVE ECLIPSE 8.0 STRL XLNG CF (GLOVE) ×4 IMPLANT
GOWN PREVENTION PLUS LG XLONG (DISPOSABLE) ×6 IMPLANT
GOWN STRL REIN XL XLG (GOWN DISPOSABLE) ×2 IMPLANT
NEEDLE HYPO 22GX1.5 SAFETY (NEEDLE) IMPLANT
NEEDLE MAYO .5 CIRCLE (NEEDLE) ×2 IMPLANT
NEEDLE SPNL 22GX3.5 QUINCKE BK (NEEDLE) IMPLANT
NS IRRIG 1000ML POUR BTL (IV SOLUTION) ×2 IMPLANT
PACK VAGINAL WOMENS (CUSTOM PROCEDURE TRAY) ×2 IMPLANT
SUT CHROMIC 2 0 TIES 18 (SUTURE) IMPLANT
SUT VIC AB 0 CT1 18XCR BRD8 (SUTURE) ×3 IMPLANT
SUT VIC AB 0 CT1 27 (SUTURE) ×1
SUT VIC AB 0 CT1 27XBRD ANBCTR (SUTURE) ×1 IMPLANT
SUT VIC AB 0 CT1 8-18 (SUTURE) ×3
SUT VICRYL 0 TIES 12 18 (SUTURE) ×2 IMPLANT
SYR TB 1ML 25GX5/8 (SYRINGE) ×2 IMPLANT
TOWEL OR 17X24 6PK STRL BLUE (TOWEL DISPOSABLE) ×4 IMPLANT
TRAY FOLEY CATH 14FR (SET/KITS/TRAYS/PACK) ×2 IMPLANT
WATER STERILE IRR 1000ML POUR (IV SOLUTION) ×2 IMPLANT

## 2011-07-23 NOTE — Transfer of Care (Signed)
Immediate Anesthesia Transfer of Care Note  Patient: [name]  Procedure(s) Performed:  HYSTERECTOMY VAGINAL  Patient Location: PACU  Anesthesia Type: General  Level of Consciousness: awake and sedated  Airway & Oxygen Therapy: Patient Spontanous Breathing and Patient connected to nasal cannula oxygen  Post-op Assessment: Report given to PACU RN and Post -op Vital signs reviewed and stable  Post vital signs: Reviewed and stable  Complications: No apparent anesthesia complications

## 2011-07-23 NOTE — Op Note (Signed)
OPERATIVE NOTE  [name]  DOB:    Aug 19, 1965  MRN:    [id number]  CSN:    [id number]  Date of Surgery:  07/23/2011  Preoperative Diagnosis:  Fibroid uterus  Menorrhagia  Dysmenorrhea  Obesity  Endometritis on endometrial biopsy  Postoperative Diagnosis:  Same  Procedure:  Vaginal hysterectomy  Uterine morcellation  Surgeon:  Leonard Schwartz, M.D.  Assistant:  Jaymes Graff M.D.  Anesthetic:  General  Disposition:  The patient is a 45 year old female, para 2-0-1-2, who presents with the above-mentioned diagnosis. She has had a Mirena intrauterine device that did not relieve her discomfort. Endometritis was questioned on her endometrial biopsy. She has been treated with antibiotics. She wishes to proceed with definitive therapy at this time. She understands the indications for surgical procedure and she accepts the risk of, but not limited to, anesthetic complications, bleeding, infections, and possible damage to the surrounding organs.  Findings:  The uterus was approximately 14 week size area notable fibroids were noted. The estimated weight was 455 g. The ovaries and the tubes appeared normal.  Procedure:  The patient was taken to the operating room where a general anesthetic was given. The patient's lower abdomen, perineum, and vagina were prepped with global layers of Betadine. A Foley catheter was placed in the bladder. The patient was sterilely draped. Examination under anesthesia was performed. The cervix was injected with 20 cc of half percent Marcaine with epinephrine. A circumferential incision was made around the cervix and the vaginal mucosa was advanced anteriorly and posteriorly. The anterior cul-de-sac and in the posterior cul-de-sac were entered. Alternating from right to left the uterosacral ligaments, paracervical tissues, parametrial tissues, and uterine arteries were clamped, cut, sutured, and tied securely. Her surgery was  confiscated by the patient obesity. Attempts were made to invert the uterus through the posterior colpotomy. These were unsuccessful. We then began to morcellate the uterus from the posterior surface. Multiple fibroids were removed sharply. Eventually we were able to invert the uterus through the cul-de-sac. The upper pedicles were clamped and cut. The uterus was removed from the operative field. Figure-of-eight sutures were used to obtain hemostasis. The pelvis was inspected and no pathology was appreciated. The sutures attached to the uterosacral ligaments were brought out through the vaginal angles and tied securely. A McCall culdoplasty suture was placed in the posterior cul-de-sac incorporating the uterosacral ligaments bilaterally and the posterior peritoneum. A final check for hemostasis was made and hemostasis was thought to be adequate. The vaginal cuff was closed using figure-of-eight sutures incorporating the anterior vaginal mucosa, the anterior peritoneum, posterior peritoneum, and the posterior vaginal mucosa. The McCall culdoplasty suture was tied securely and the apex of the vagina was noted to elevate into the midpelvis. Sponge, needle, and isthmic counts were correct on 2 occasions. The estimated blood loss for the procedure was 200 cc. The patient tolerated her procedure well. She was awakened from her anesthetic without difficulty and transported to the recovery room in stable condition. The morcellated uterus was sent to pathology.  Leonard Schwartz, M.D.

## 2011-07-23 NOTE — Addendum Note (Signed)
Addendum  created 07/23/11 1509 by Damond Borchers   Modules edited:Notes Section    

## 2011-07-23 NOTE — Anesthesia Procedure Notes (Signed)
Procedure Name: Intubation Date/Time: 07/23/2011 10:30 AM Performed by: Jantzen Pilger MARIE Pre-anesthesia Checklist: Patient identified, Patient being monitored, Emergency Drugs available, Timeout performed and Suction available Patient Re-evaluated:Patient Re-evaluated prior to inductionOxygen Delivery Method: Circle System Utilized Preoxygenation: Pre-oxygenation with 100% oxygen Intubation Type: IV induction Ventilation: Mask ventilation without difficulty Laryngoscope Size: Mac and 4 Grade View: Grade III Tube type: Oral Airway Equipment and Method: video-laryngoscopy Placement Confirmation: ETT inserted through vocal cords under direct vision,  breath sounds checked- equal and bilateral and positive ETCO2 Secured at: 22 cm Dental Injury: Teeth and Oropharynx as per pre-operative assessment

## 2011-07-23 NOTE — Addendum Note (Signed)
Addendum  created 07/23/11 1509 by Pat Patrick   Modules edited:Notes Section

## 2011-07-23 NOTE — H&P (Signed)
The patient was interviewed and examined today.  The previously documented history and physical examination was reviewed. There are no changes except that the endometrial biopsy returned showing benign endometrial cell and chronic endometritis (history of IUD use) . The operative procedure was reviewed. The risks and benefits were outlined again. The specific risks include, but are not limited to, anesthetic complications, bleeding, infections, and possible damage to the surrounding organs. The patient's questions were answered.  We are ready to proceed as outlined. The likelihood of the patient achieving the goals of this procedure is very likely. The patient was offered the option of canceling her surgery and rescheduling at a later time. She wants to proceed today. We will give the patient antibiotics.  Leonard Schwartz, M.D.

## 2011-07-23 NOTE — Anesthesia Postprocedure Evaluation (Signed)
  Anesthesia Post-op Note  Patient: [name]  Procedure(s) Performed:  HYSTERECTOMY VAGINAL  Patient Location: PACU  Anesthesia Type: General  Level of Consciousness: awake, alert  and oriented  Airway and Oxygen Therapy: Patient Spontanous Breathing  Post-op Pain: none  Post-op Assessment: Post-op Vital signs reviewed, Patient's Cardiovascular Status Stable, Respiratory Function Stable, Patent Airway, No signs of Nausea or vomiting and Pain level controlled  Post-op Vital Signs: Reviewed and stable  Complications: No apparent anesthesia complications

## 2011-07-23 NOTE — Anesthesia Postprocedure Evaluation (Signed)
  Anesthesia Post-op Note  Patient: [name]  Procedure(s) Performed:  HYSTERECTOMY VAGINAL  Patient Location: PACU and Women's Unit  Anesthesia Type: General  Level of Consciousness: awake, alert  and oriented  Airway and Oxygen Therapy: Patient Spontanous Breathing  Post-op Pain: none  Post-op Assessment: Post-op Vital signs reviewed and Patient's Cardiovascular Status Stable  Post-op Vital Signs: Reviewed and stable  Complications: No apparent anesthesia complications

## 2011-07-23 NOTE — Progress Notes (Signed)
Subjective: Patient reports doing well. Minimal pain.    Objective: I have reviewed patient's vital signs and intake and output.  General: alert and no distress Resp: clear to auscultation bilaterally Cardio: regular rate and rhythm, S1, S2 normal, no murmur, click, rub or gallop GI: soft, non-tender; bowel sounds normal; no masses,  no organomegaly Extremities: extremities normal, atraumatic, no cyanosis or edema Vaginal Bleeding: none   Assessment/Plan: Doing well S/P vaginal hysterectomy. Endometritis  Plan discharge in the morning. Will continue antibiotics because of the possibility of endometritis.  LOS: 0 days    [name] V 07/23/2011, 6:01 PM

## 2011-07-23 NOTE — Anesthesia Preprocedure Evaluation (Signed)
Anesthesia Evaluation  Patient identified by MRN, date of birth, ID band Patient awake    Reviewed: Allergy & Precautions, H&P , NPO status , Patient's Chart, lab work & pertinent test results  Airway Mallampati: III TM Distance: >3 FB Neck ROM: full    Dental No notable dental hx.    Pulmonary neg pulmonary ROS,    Pulmonary exam normal       Cardiovascular neg cardio ROS     Neuro/Psych  Headaches, Negative Psych ROS   GI/Hepatic   Endo/Other  Morbid obesity  Renal/GU negative Renal ROS  Genitourinary negative   Musculoskeletal   Abdominal Normal abdominal exam  (+)   Peds  Hematology   Anesthesia Other Findings   Reproductive/Obstetrics negative OB ROS                           Anesthesia Physical Anesthesia Plan  ASA: III  Anesthesia Plan: General ETT   Post-op Pain Management:    Induction:   Airway Management Planned:   Additional Equipment:   Intra-op Plan:   Post-operative Plan:   Informed Consent: I have reviewed the patients History and Physical, chart, labs and discussed the procedure including the risks, benefits and alternatives for the proposed anesthesia with the patient or authorized representative who has indicated his/her understanding and acceptance.   Dental Advisory Given  Plan Discussed with: Anesthesiologist, CRNA and Surgeon  Anesthesia Plan Comments:         Anesthesia Quick Evaluation

## 2011-07-24 LAB — CBC
HCT: 34.3 % — ABNORMAL LOW (ref 36.0–46.0)
Hemoglobin: 11.3 g/dL — ABNORMAL LOW (ref 12.0–15.0)
MCHC: 32.9 g/dL (ref 30.0–36.0)
RBC: 3.87 MIL/uL (ref 3.87–5.11)
WBC: 13.6 10*3/uL — ABNORMAL HIGH (ref 4.0–10.5)

## 2011-07-24 MED ORDER — METRONIDAZOLE 500 MG PO TABS: 500.0000 mg | ORAL_TABLET | Freq: Three times a day (TID) | ORAL | Status: AC

## 2011-07-24 MED ORDER — FERROUS SULFATE 325 (65 FE) MG PO TABS: 325.0000 mg | ORAL_TABLET | Freq: Two times a day (BID) | ORAL | Status: DC

## 2011-07-24 MED ORDER — DEXTROSE IN LACTATED RINGERS 5 % IV SOLN
INTRAVENOUS | Status: DC
  Administered 2011-07-24: 05:00:00 via INTRAVENOUS

## 2011-07-24 MED ORDER — TRAMADOL HCL 50 MG PO TABS: 50.0000 mg | ORAL_TABLET | Freq: Four times a day (QID) | ORAL | Status: AC | PRN

## 2011-07-24 MED ORDER — OXYCODONE-ACETAMINOPHEN 5-325 MG PO TABS: 1.0000 | ORAL_TABLET | ORAL | Status: AC | PRN

## 2011-07-24 MED ORDER — SULFAMETHOXAZOLE-TRIMETHOPRIM 800-160 MG PO TABS: 1.0000 | ORAL_TABLET | Freq: Two times a day (BID) | ORAL | Status: AC

## 2011-07-24 MED ORDER — IBUPROFEN 200 MG PO TABS: 800.0000 mg | ORAL_TABLET | Freq: Three times a day (TID) | ORAL | Status: AC | PRN

## 2011-07-24 NOTE — Progress Notes (Signed)
Subjective: Patient reports tolerating PO and no problems voiding.    Objective: I have reviewed patient's vital signs and labs.  General: alert and no distress Resp: clear to auscultation bilaterally Cardio: regular rate and rhythm, S1, S2 normal, no murmur, click, rub or gallop GI: soft, non-tender; bowel sounds normal; no masses,  no organomegaly Vaginal Bleeding: minimal  CBC    Component Value Date/Time   WBC 13.6* 07/24/2011 0515   RBC 3.87 07/24/2011 0515   HGB 11.3* 07/24/2011 0515   HCT 34.3* 07/24/2011 0515   PLT 295 07/24/2011 0515   MCV 88.6 07/24/2011 0515   MCH 29.2 07/24/2011 0515   MCHC 32.9 07/24/2011 0515   RDW 15.0 07/24/2011 0515      Assessment/Plan: Doing Well. Ready for discharge after breakfast. Anemia - mild.  LOS: 1 day    Manya Balash V 07/24/2011, 8:23 AM

## 2011-07-24 NOTE — Progress Notes (Signed)
1 Day Post-Op Procedure(s) (LRB): HYSTERECTOMY VAGINAL (N/A)  Subjective: Patient reports that pain is well managed.  Tolerating normal diet as tolerated  diet without difficulty. Single episode of nausea after being up, ambulating and returning to bed. Hasn't voided yet nor passed flatus. C/O throat irritation.   Objective: BP 128/76  Pulse 66  Temp(Src) 98.9 F (37.2 C) (Oral)  Resp 20  Ht 5\' 8"  (1.727 m)  Wt 133.358 kg (294 lb)  BMI 44.70 kg/m2  SpO2 100%  LMP 06/25/2011 Lungs: clear Heart: normal rate and rhythm Abdomen:soft and appropriately tender Extremities: Homans sign is negative, no sign of DVT I  Assessment: s/p Procedure(s): HYSTERECTOMY VAGINAL: stable and progressing well  Plan:  Throat lozenges Encourage ambulation Discharge home later today   LOS: 1 day    Vickie Buster, PA-C 07/24/2011, 7:13 AM

## 2011-07-27 ENCOUNTER — Encounter (HOSPITAL_COMMUNITY): Payer: Self-pay | Admitting: Obstetrics and Gynecology

## 2012-01-19 ENCOUNTER — Other Ambulatory Visit: Payer: Self-pay | Admitting: Family Medicine

## 2012-01-19 DIAGNOSIS — Z1231 Encounter for screening mammogram for malignant neoplasm of breast: Secondary | ICD-10-CM

## 2012-02-19 ENCOUNTER — Ambulatory Visit: Payer: BC Managed Care – PPO

## 2012-02-19 ENCOUNTER — Ambulatory Visit
Admission: RE | Admit: 2012-02-19 | Discharge: 2012-02-19 | Disposition: A | Discharge status: Home / Self Care | Payer: BC Managed Care – PPO | Source: Ambulatory Visit | Attending: Family Medicine | Admitting: Family Medicine

## 2012-02-19 DIAGNOSIS — Z1231 Encounter for screening mammogram for malignant neoplasm of breast: Secondary | ICD-10-CM

## 2012-11-30 ENCOUNTER — Other Ambulatory Visit: Payer: Self-pay | Admitting: Gastroenterology

## 2013-01-10 ENCOUNTER — Other Ambulatory Visit: Payer: Self-pay

## 2013-01-10 DIAGNOSIS — Z1231 Encounter for screening mammogram for malignant neoplasm of breast: Secondary | ICD-10-CM

## 2013-02-20 ENCOUNTER — Ambulatory Visit: Payer: BC Managed Care – PPO

## 2013-02-20 ENCOUNTER — Ambulatory Visit
Admission: RE | Admit: 2013-02-20 | Discharge: 2013-02-20 | Disposition: A | Discharge status: Home / Self Care | Payer: BC Managed Care – PPO | Source: Ambulatory Visit

## 2013-02-20 DIAGNOSIS — Z1231 Encounter for screening mammogram for malignant neoplasm of breast: Secondary | ICD-10-CM

## 2014-01-13 ENCOUNTER — Emergency Department (INDEPENDENT_AMBULATORY_CARE_PROVIDER_SITE_OTHER)
Admission: EM | Admit: 2014-01-13 | Discharge: 2014-01-13 | Disposition: A | Discharge status: Home / Self Care | Payer: BC Managed Care – PPO | Source: Home / Self Care | Attending: Family Medicine | Admitting: Family Medicine

## 2014-01-13 ENCOUNTER — Encounter (HOSPITAL_COMMUNITY): Payer: Self-pay | Admitting: Emergency Medicine

## 2014-01-13 DIAGNOSIS — R05 Cough: Secondary | ICD-10-CM

## 2014-01-13 DIAGNOSIS — R059 Cough, unspecified: Secondary | ICD-10-CM

## 2014-01-13 DIAGNOSIS — J029 Acute pharyngitis, unspecified: Secondary | ICD-10-CM

## 2014-01-13 DIAGNOSIS — J309 Allergic rhinitis, unspecified: Secondary | ICD-10-CM

## 2014-01-13 DIAGNOSIS — J302 Other seasonal allergic rhinitis: Secondary | ICD-10-CM

## 2014-01-13 LAB — POCT RAPID STREP A: Streptococcus, Group A Screen (Direct): NEGATIVE

## 2014-01-13 MED ORDER — HYDROCOD POLST-CHLORPHEN POLST 10-8 MG/5ML PO LQCR: 5.0000 mL | Freq: Two times a day (BID) | ORAL | Status: AC | PRN

## 2014-01-13 MED ORDER — FLUTICASONE PROPIONATE 50 MCG/ACT NA SUSP: 2.0000 | Freq: Every day | NASAL | Status: AC

## 2014-01-13 MED ORDER — METHYLPREDNISOLONE SODIUM SUCC 125 MG IJ SOLR
80.0000 mg | Freq: Once | INTRAMUSCULAR | Status: AC
  Administered 2014-01-13: 80 mg via INTRAMUSCULAR

## 2014-01-13 MED ORDER — METHYLPREDNISOLONE SODIUM SUCC 125 MG IJ SOLR
INTRAMUSCULAR | Status: AC
  Filled 2014-01-13: qty 2

## 2014-01-13 NOTE — ED Provider Notes (Signed)
Medical screening examination/treatment/procedure(s) were performed by a resident physician or non-physician practitioner and as the supervising physician I was immediately available for consultation/collaboration.  Lynne Leader, MD    Gregor Hams, MD 01/13/14 [phone]

## 2014-01-13 NOTE — ED Provider Notes (Signed)
CSN: [id number]     Arrival date & time 01/13/14  1646 History   First MD Initiated Contact with Patient 01/13/14 1709     Chief Complaint  Patient presents with  . Sore Throat  . Neck Pain    Patient is a 48 y.o. female presenting with pharyngitis. The history is provided by the patient.  Sore Throat This is a recurrent problem. The current episode started yesterday. The problem occurs constantly. The problem has been gradually worsening. Pertinent negatives include no shortness of breath. The symptoms are aggravated by swallowing. She has tried nothing for the symptoms.  Pt reports onset of sore throat and neck pain yesterday. She has also had a persistent harsh cough. States she was treated for strep a month ago but has not felt back to normal since that time. Describes increased PND. Has started Zyrtec daily. Pt wanted to be sure she did not have strep throat again. Denies fever or other associated symptoms.   Past Medical History  Diagnosis Date  . IBS (irritable bowel syndrome)   . HENIDPOE(423.5)    Past Surgical History  Procedure Laterality Date  . Cholecystectomy    . Vaginal hysterectomy  07/23/2011    Procedure: HYSTERECTOMY VAGINAL;  Surgeon: Eli Hose, MD;  Location: [facility];  Service: Gynecology;  Laterality: N/A;   No family history on file. History  Substance Use Topics  . Smoking status: Never Smoker   . Smokeless tobacco: Not on file  . Alcohol Use: Yes     Comment: socially   OB History   Grav Para Term Preterm Abortions TAB SAB Ect Mult Living                 Review of Systems  Constitutional: Negative for fever and chills.  HENT: Positive for postnasal drip. Negative for ear pain, rhinorrhea, sinus pressure, sneezing, trouble swallowing and voice change.   Respiratory: Positive for cough. Negative for shortness of breath, wheezing and stridor.   Cardiovascular: Negative.   Gastrointestinal: Negative.   Endocrine: Negative.   Genitourinary:  Negative.   Musculoskeletal: Negative.   Allergic/Immunologic: Positive for environmental allergies.  Neurological: Negative.   Hematological: Negative.   Psychiatric/Behavioral: Negative.     Allergies  Review of patient's allergies indicates no known allergies.  Home Medications   Prior to Admission medications   Medication Sig Start Date End Date Taking? Authorizing Provider  cetirizine (ZYRTEC) 10 MG tablet Take 10 mg by mouth daily.   Yes Historical Provider, MD  Probiotic Product (SOLUBLE FIBER/PROBIOTICS PO) Take by mouth.   Yes Historical Provider, MD  chlorpheniramine-HYDROcodone (TUSSIONEX PENNKINETIC ER) 10-8 MG/5ML LQCR Take 5 mLs by mouth every 12 (twelve) hours as needed for cough. 01/13/14   Rhetta Mura Breeanna Galgano, NP  ferrous sulfate (FERROUSUL) 325 (65 FE) MG tablet Take 1 tablet (325 mg total) by mouth 2 (two) times daily with a meal. 07/24/11 07/23/12  Ena Dawley, MD  fluticasone [facility]) 50 MCG/ACT nasal spray Place 2 sprays into both nostrils daily. For a week then one spray in each nostril daily 01/13/14   Rhetta Mura Kamsiyochukwu Buist, NP  hyoscyamine (LEVBID) 0.375 MG 12 hr tablet Take 0.375 mg by mouth every 12 (twelve) hours as needed. For bowel spasms.     Historical Provider, MD   BP 150/92  Pulse 81  Temp(Src) 98.9 F (37.2 C) (Oral)  Resp 17  SpO2 96%  LMP 07/04/2011 Physical Exam  Nursing note and vitals reviewed. Constitutional: She is oriented to  person, place, and time. She appears well-developed and well-nourished.  HENT:  Head: Normocephalic and atraumatic.  Nose: Nose normal.  Mouth/Throat: Uvula is midline, oropharynx is clear and moist and mucous membranes are normal.  Cobblestoning to posterior pharynx  Eyes: Conjunctivae are normal.  Neck: Neck supple.  Cardiovascular: Normal rate.   Pulmonary/Chest: Effort normal.  Lymphadenopathy:    She has no cervical adenopathy.  Neurological: She is alert and oriented to person, place, and time.  Skin:  Skin is warm and dry.  Psychiatric: She has a normal mood and affect.    ED Course  Procedures (including critical care time) Labs Review Labs Reviewed  POCT RAPID STREP A ([facility] ONLY)    Imaging Review No results found.   MDM   1. Sore throat   2. Cough   3. Seasonal allergic reaction    Solu-medrol 80 mg IM here Flonase [place] Zrytec QD PRN tussionex for cough.     Jeryl Columbia, NP 01/13/14 [phone]

## 2014-01-13 NOTE — ED Notes (Signed)
Patient complains of sore throat and left neck pain since 12/12/13-yesterday.  Patient also reports she was treated for strep one month ago 5/6-treated with antibiotic shot.  Patient reports a cough developed after being treated for strep and never felt completely well since then.

## 2014-01-13 NOTE — Discharge Instructions (Signed)
The likely source of your symptoms is a seasonal allergy reaction. Continue your Zyrtec daily. Start the Flonase as directed and use the Tussionex cough syrup as needed for cough.   Cough, Adult  A cough is a reflex. It helps you clear your throat and airways. A cough can help heal your body. A cough can last 2 or 3 weeks (acute) or may last more than 8 weeks (chronic). Some common causes of a cough can include an infection, allergy, or a cold. HOME CARE  Only take medicine as told by your doctor.  If given, take your medicines (antibiotics) as told. Finish them even if you start to feel better.  Use a cold steam vaporizer or humidier in your home. This can help loosen thick spit (secretions).  Sleep so you are almost sitting up (semi-upright). Use pillows to do this. This helps reduce coughing.  Rest as needed.  Stop smoking if you smoke. GET HELP RIGHT AWAY IF:  You have yellowish-white fluid (pus) in your thick spit.  Your cough gets worse.  Your medicine does not reduce coughing, and you are losing sleep.  You cough up blood.  You have trouble breathing.  Your pain gets worse and medicine does not help.  You have a fever. MAKE SURE YOU:   Understand these instructions.  Will watch your condition.  Will get help right away if you are not doing well or get worse. Document Released: 04/09/2011 Document Revised: 10/19/2011 Document Reviewed: 04/09/2011 [facility] Patient Information 2014 [place].  Salt Water Gargle This solution will help make your mouth and throat feel better. HOME CARE INSTRUCTIONS   Mix 1 teaspoon of salt in 8 ounces of warm water.  Gargle with this solution as much or often as you need or as directed. Swish and gargle gently if you have any sores or wounds in your mouth.  Do not swallow this mixture. Document Released: 04/30/2004 Document Revised: 10/19/2011 Document Reviewed: 09/21/2008 [facility] Patient Information 2014 [place].  Hay Fever  Hay fever is a type of allergy that people have to things like grass, animals, or pollen from plants and flowers. It cannot be passed from one person to another. You cannot cure hay fever, but there are things that may help relieve your problems (symptoms). HOME CARE  Avoid the things that may be causing your problems.  Take all medicine as told by your doctor. GET HELP RIGHT AWAY IF:  You have asthma, a cough, and you start making whistling sounds when breathing (wheezing).  Your tongue or lips are puffy (swollen).  You have trouble breathing.  You feel lightheaded or like you will pass out (faint).  You have a fever.  Your problems are getting worse and your medicine is not helping.  Your treatment was working, but your problems have come back.  You are stuffed up (congested) and have pressure in your face.  You have a headache.  You have cold sweats. MAKE SURE YOU:  Understand these instructions.  Will watch your condition.  Will get help right away if you are not doing well or get worse. Document Released: 11/26/2010 Document Revised: 10/19/2011 Document Reviewed: 11/26/2010 [facility] Patient Information 2014 [place], [place].

## 2014-01-15 LAB — CULTURE, GROUP A STREP

## 2014-01-23 ENCOUNTER — Other Ambulatory Visit: Payer: Self-pay

## 2014-01-23 DIAGNOSIS — Z1231 Encounter for screening mammogram for malignant neoplasm of breast: Secondary | ICD-10-CM

## 2014-02-22 ENCOUNTER — Encounter (INDEPENDENT_AMBULATORY_CARE_PROVIDER_SITE_OTHER): Payer: Self-pay

## 2014-02-22 ENCOUNTER — Ambulatory Visit
Admission: RE | Admit: 2014-02-22 | Discharge: 2014-02-22 | Disposition: A | Discharge status: Home / Self Care | Payer: BC Managed Care – PPO | Source: Ambulatory Visit

## 2014-02-22 DIAGNOSIS — Z1231 Encounter for screening mammogram for malignant neoplasm of breast: Secondary | ICD-10-CM

## 2014-03-02 ENCOUNTER — Encounter (HOSPITAL_COMMUNITY): Payer: Self-pay | Admitting: Emergency Medicine

## 2014-03-02 ENCOUNTER — Emergency Department (INDEPENDENT_AMBULATORY_CARE_PROVIDER_SITE_OTHER)
Admission: EM | Admit: 2014-03-02 | Discharge: 2014-03-02 | Disposition: A | Discharge status: Home / Self Care | Payer: BC Managed Care – PPO | Source: Home / Self Care

## 2014-03-02 DIAGNOSIS — J302 Other seasonal allergic rhinitis: Secondary | ICD-10-CM

## 2014-03-02 DIAGNOSIS — R0982 Postnasal drip: Secondary | ICD-10-CM

## 2014-03-02 DIAGNOSIS — J3089 Other allergic rhinitis: Secondary | ICD-10-CM

## 2014-03-02 DIAGNOSIS — J029 Acute pharyngitis, unspecified: Secondary | ICD-10-CM

## 2014-03-02 LAB — POCT RAPID STREP A: Streptococcus, Group A Screen (Direct): NEGATIVE

## 2014-03-02 NOTE — ED Provider Notes (Signed)
CSN: [id number]     Arrival date & time 03/02/14  1848 History   First MD Initiated Contact with Patient 03/02/14 1959     No chief complaint on file.  (Consider location/radiation/quality/duration/timing/severity/associated sxs/prior Treatment) HPI Comments: 48 year old morbidly obese female complaining of a sore throat for 5 days. She saw her PCP yesterday and was given Tessalon Perles for cough and Magic mouthwash. She states that her throat is feeling worse. She has a history of strep throat last May. She also has a history of seasonal allergies with PND and nasal congestion for which she takes Zyrtec and Flonase. Denies fever.   Past Medical History  Diagnosis Date  . IBS (irritable bowel syndrome)   . LTJQZESP(233.0)    Past Surgical History  Procedure Laterality Date  . Cholecystectomy    . Vaginal hysterectomy  07/23/2011    Procedure: HYSTERECTOMY VAGINAL;  Surgeon: Eli Hose, MD;  Location: [facility];  Service: Gynecology;  Laterality: N/A;   No family history on file. History  Substance Use Topics  . Smoking status: Never Smoker   . Smokeless tobacco: Not on file  . Alcohol Use: Yes     Comment: socially   OB History   Grav Para Term Preterm Abortions TAB SAB Ect Mult Living                 Review of Systems  Constitutional: Negative for fever, chills, activity change and appetite change.  HENT: Positive for congestion, postnasal drip, rhinorrhea and sore throat. Negative for facial swelling.   Eyes: Negative.   Respiratory: Positive for cough. Negative for shortness of breath and wheezing.   Cardiovascular: Negative.  Negative for chest pain.  Gastrointestinal: Negative.   Musculoskeletal: Negative for neck pain and neck stiffness.  Skin: Negative for pallor and rash.  Neurological: Negative.     Allergies  Review of patient's allergies indicates no known allergies.  Home Medications   Prior to Admission medications   Medication Sig Start Date End  Date Taking? Authorizing Provider  cetirizine (ZYRTEC) 10 MG tablet Take 10 mg by mouth daily.    Historical Provider, MD  chlorpheniramine-HYDROcodone (TUSSIONEX PENNKINETIC ER) 10-8 MG/5ML LQCR Take 5 mLs by mouth every 12 (twelve) hours as needed for cough. 01/13/14   Rhetta Mura Schorr, NP  ferrous sulfate (FERROUSUL) 325 (65 FE) MG tablet Take 1 tablet (325 mg total) by mouth 2 (two) times daily with a meal. 07/24/11 07/23/12  Ena Dawley, MD  fluticasone [facility]) 50 MCG/ACT nasal spray Place 2 sprays into both nostrils daily. For a week then one spray in each nostril daily 01/13/14   Rhetta Mura Schorr, NP  hyoscyamine (LEVBID) 0.375 MG 12 hr tablet Take 0.375 mg by mouth every 12 (twelve) hours as needed. For bowel spasms.     Historical Provider, MD  Probiotic Product (SOLUBLE FIBER/PROBIOTICS PO) Take by mouth.    Historical Provider, MD   BP 158/92  Pulse 87  Temp(Src) 98.4 F (36.9 C) (Oral)  Resp 18  SpO2 95%  LMP 07/04/2011 Physical Exam  Nursing note and vitals reviewed. Constitutional: She is oriented to person, place, and time. She appears well-developed and well-nourished. No distress.  HENT:  Mouth/Throat: No oropharyngeal exudate.  Bilateral TMs are normal Oropharynx with moderate erythema and clear PND.  Eyes: Conjunctivae and EOM are normal. Pupils are equal, round, and reactive to light.  Neck: Normal range of motion. Neck supple.  Cardiovascular: Normal rate, regular rhythm and normal heart sounds.  Pulmonary/Chest: Effort normal and breath sounds normal. She has no wheezes. She has no rales.  Musculoskeletal: Normal range of motion.  Lymphadenopathy:    She has no cervical adenopathy.  Neurological: She is alert and oriented to person, place, and time.  Skin: Skin is warm and dry.  Psychiatric: She has a normal mood and affect.    ED Course  Procedures (including critical care time) Labs Review Labs Reviewed  POCT RAPID STREP A ([place])    Results for orders placed during the hospital encounter of 03/02/14  POCT RAPID STREP A ([facility] ONLY)      Result Value Ref Range   Streptococcus, Group A Screen (Direct) NEGATIVE  NEGATIVE    Imaging Review No results found.   MDM   1. Pharyngitis   2. PND (post-nasal drip)   3. Other seasonal allergic rhinitis      Continue zyrtec. May add ChlorTrimeton 2 mg every 6 hours as needed for drainage. Continue nasal sprays, saline and other. Ibuprofen 600 mg every 6 h for sore throat. Cepacol lozenges    Janne Napoleon, NP 03/02/14 2101

## 2014-03-02 NOTE — Discharge Instructions (Signed)
Allergic Rhinitis Continue zyrtec. May add ChlorTrimeton 2 mg every 6 hours as needed for drainage. Continue nasal sprays, saline and other. Ibuprofen 600 mg every 6 h for sore throat. Cepacol lozenges Allergic rhinitis is when the mucous membranes in the nose respond to allergens. Allergens are particles in the air that cause your body to have an allergic reaction. This causes you to release allergic antibodies. Through a chain of events, these eventually cause you to release histamine into the blood stream. Although meant to protect the body, it is this release of histamine that causes your discomfort, such as frequent sneezing, congestion, and an itchy, runny nose.  CAUSES  Seasonal allergic rhinitis (hay fever) is caused by pollen allergens that may come from grasses, trees, and weeds. Year-round allergic rhinitis (perennial allergic rhinitis) is caused by allergens such as house dust mites, pet dander, and mold spores.  SYMPTOMS   Nasal stuffiness (congestion).  Itchy, runny nose with sneezing and tearing of the eyes. DIAGNOSIS  Your health care provider can help you determine the allergen or allergens that trigger your symptoms. If you and your health care provider are unable to determine the allergen, skin or blood testing may be used. TREATMENT  Allergic rhinitis does not have a cure, but it can be controlled by:  Medicines and allergy shots (immunotherapy).  Avoiding the allergen. Hay fever may often be treated with antihistamines in pill or nasal spray forms. Antihistamines block the effects of histamine. There are over-the-counter medicines that may help with nasal congestion and swelling around the eyes. Check with your health care provider before taking or giving this medicine.  If avoiding the allergen or the medicine prescribed do not work, there are many new medicines your health care provider can prescribe. Stronger medicine may be used if initial measures are ineffective.  Desensitizing injections can be used if medicine and avoidance does not work. Desensitization is when a patient is given ongoing shots until the body becomes less sensitive to the allergen. Make sure you follow up with your health care provider if problems continue. HOME CARE INSTRUCTIONS It is not possible to completely avoid allergens, but you can reduce your symptoms by taking steps to limit your exposure to them. It helps to know exactly what you are allergic to so that you can avoid your specific triggers. SEEK MEDICAL CARE IF:   You have a fever.  You develop a cough that does not stop easily (persistent).  You have shortness of breath.  You start wheezing.  Symptoms interfere with normal daily activities. Document Released: 04/21/2001 Document Revised: 08/01/2013 Document Reviewed: 04/03/2013 [facility] Patient Information 2015 [place], [place]. This information is not intended to replace advice given to you by your health care provider. Make sure you discuss any questions you have with your health care provider.  Pharyngitis Pharyngitis is redness, pain, and swelling (inflammation) of your pharynx.  CAUSES  Pharyngitis is usually caused by infection. Most of the time, these infections are from viruses (viral) and are part of a cold. However, sometimes pharyngitis is caused by bacteria (bacterial). Pharyngitis can also be caused by allergies. Viral pharyngitis may be spread from person to person by coughing, sneezing, and personal items or utensils (cups, forks, spoons, toothbrushes). Bacterial pharyngitis may be spread from person to person by more intimate contact, such as kissing.  SIGNS AND SYMPTOMS  Symptoms of pharyngitis include:   Sore throat.   Tiredness (fatigue).   Low-grade fever.   Headache.  Joint pain and muscle  aches.  Skin rashes.  Swollen lymph nodes.  Plaque-like film on throat or tonsils (often seen with bacterial pharyngitis). DIAGNOSIS  Your  health care provider will ask you questions about your illness and your symptoms. Your medical history, along with a physical exam, is often all that is needed to diagnose pharyngitis. Sometimes, a rapid strep test is done. Other lab tests may also be done, depending on the suspected cause.  TREATMENT  Viral pharyngitis will usually get better in 3-4 days without the use of medicine. Bacterial pharyngitis is treated with medicines that kill germs (antibiotics).  HOME CARE INSTRUCTIONS   Drink enough water and fluids to keep your urine clear or pale yellow.   Only take over-the-counter or prescription medicines as directed by your health care provider:   If you are prescribed antibiotics, make sure you finish them even if you start to feel better.   Do not take aspirin.   Get lots of rest.   Gargle with 8 oz of salt water ( tsp of salt per 1 qt of water) as often as every 1-2 hours to soothe your throat.   Throat lozenges (if you are not at risk for choking) or sprays may be used to soothe your throat. SEEK MEDICAL CARE IF:   You have large, tender lumps in your neck.  You have a rash.  You cough up green, yellow-brown, or bloody spit. SEEK IMMEDIATE MEDICAL CARE IF:   Your neck becomes stiff.  You drool or are unable to swallow liquids.  You vomit or are unable to keep medicines or liquids down.  You have severe pain that does not go away with the use of recommended medicines.  You have trouble breathing (not caused by a stuffy nose). MAKE SURE YOU:   Understand these instructions.  Will watch your condition.  Will get help right away if you are not doing well or get worse. Document Released: 07/27/2005 Document Revised: 05/17/2013 Document Reviewed: 04/03/2013 [facility] [place] Patient Information 2015 [place], [place]. This information is not intended to replace advice given to you by your health care provider. Make sure you discuss any questions you have with your health  care provider.  Sore Throat A sore throat is pain, burning, irritation, or scratchiness of the throat. There is often pain or tenderness when swallowing or talking. A sore throat may be accompanied by other symptoms, such as coughing, sneezing, fever, and swollen neck glands. A sore throat is often the first sign of another sickness, such as a cold, flu, strep throat, or mononucleosis (commonly known as mono). Most sore throats go away without medical treatment. CAUSES  The most common causes of a sore throat include:  A viral infection, such as a cold, flu, or mono.  A bacterial infection, such as strep throat, tonsillitis, or whooping cough.  Seasonal allergies.  Dryness in the air.  Irritants, such as smoke or pollution.  Gastroesophageal reflux disease (GERD). HOME CARE INSTRUCTIONS   Only take over-the-counter medicines as directed by your caregiver.  Drink enough fluids to keep your urine clear or pale yellow.  Rest as needed.  Try using throat sprays, lozenges, or sucking on hard candy to ease any pain (if older than 4 years or as directed).  Sip warm liquids, such as broth, herbal tea, or warm water with honey to relieve pain temporarily. You may also eat or drink cold or frozen liquids such as frozen ice pops.  Gargle with salt water (mix 1 tsp salt with 8  oz of water).  Do not smoke and avoid secondhand smoke.  Put a cool-mist humidifier in your bedroom at night to moisten the air. You can also turn on a hot shower and sit in the bathroom with the door closed for 5-10 minutes. SEEK IMMEDIATE MEDICAL CARE IF:  You have difficulty breathing.  You are unable to swallow fluids, soft foods, or your saliva.  You have increased swelling in the throat.  Your sore throat does not get better in 7 days.  You have nausea and vomiting.  You have a fever or persistent symptoms for more than 2-3 days.  You have a fever and your symptoms suddenly get worse. MAKE SURE YOU:     Understand these instructions.  Will watch your condition.  Will get help right away if you are not doing well or get worse. Document Released: 09/03/2004 Document Revised: 07/13/2012 Document Reviewed: 04/03/2012 [facility] Patient Information 2015 [place], [place]. This information is not intended to replace advice given to you by your health care provider. Make sure you discuss any questions you have with your health care provider.

## 2014-03-02 NOTE — ED Notes (Signed)
Concern for repeated ST, congestion. Provider eval only

## 2014-03-02 NOTE — ED Provider Notes (Signed)
Medical screening examination/treatment/procedure(s) were performed by a resident physician or non-physician practitioner and as the supervising physician I was immediately available for consultation/collaboration.  Linna Darner, MD Family Medicine   Waldemar Dickens, MD 03/02/14 [phone]

## 2014-03-04 LAB — CULTURE, GROUP A STREP

## 2015-02-18 ENCOUNTER — Other Ambulatory Visit: Payer: Self-pay

## 2015-02-18 DIAGNOSIS — Z1231 Encounter for screening mammogram for malignant neoplasm of breast: Secondary | ICD-10-CM

## 2015-02-25 ENCOUNTER — Ambulatory Visit
Admission: RE | Admit: 2015-02-25 | Discharge: 2015-02-25 | Disposition: A | Discharge status: Home / Self Care | Payer: BC Managed Care – PPO | Source: Ambulatory Visit

## 2015-02-25 DIAGNOSIS — Z1231 Encounter for screening mammogram for malignant neoplasm of breast: Secondary | ICD-10-CM

## 2016-02-18 ENCOUNTER — Other Ambulatory Visit: Payer: Self-pay | Admitting: Family Medicine

## 2016-02-18 DIAGNOSIS — Z1231 Encounter for screening mammogram for malignant neoplasm of breast: Secondary | ICD-10-CM

## 2016-03-09 ENCOUNTER — Ambulatory Visit
Admission: RE | Admit: 2016-03-09 | Discharge: 2016-03-09 | Disposition: A | Discharge status: Home / Self Care | Payer: BC Managed Care – PPO | Source: Ambulatory Visit | Attending: Family Medicine | Admitting: Family Medicine

## 2016-03-09 DIAGNOSIS — Z1231 Encounter for screening mammogram for malignant neoplasm of breast: Secondary | ICD-10-CM

## 2017-01-29 ENCOUNTER — Other Ambulatory Visit: Payer: Self-pay | Admitting: Family Medicine

## 2017-01-29 DIAGNOSIS — Z1231 Encounter for screening mammogram for malignant neoplasm of breast: Secondary | ICD-10-CM

## 2017-02-24 ENCOUNTER — Other Ambulatory Visit: Payer: Self-pay | Admitting: Family Medicine

## 2017-02-24 DIAGNOSIS — S46212A Strain of muscle, fascia and tendon of other parts of biceps, left arm, initial encounter: Secondary | ICD-10-CM

## 2017-03-01 ENCOUNTER — Ambulatory Visit
Admission: RE | Admit: 2017-03-01 | Discharge: 2017-03-01 | Disposition: A | Discharge status: Home / Self Care | Payer: BC Managed Care – PPO | Source: Ambulatory Visit | Attending: Family Medicine | Admitting: Family Medicine

## 2017-03-01 DIAGNOSIS — S46212A Strain of muscle, fascia and tendon of other parts of biceps, left arm, initial encounter: Secondary | ICD-10-CM

## 2017-03-10 ENCOUNTER — Ambulatory Visit
Admission: RE | Admit: 2017-03-10 | Discharge: 2017-03-10 | Disposition: A | Discharge status: Home / Self Care | Payer: BC Managed Care – PPO | Source: Ambulatory Visit | Attending: Family Medicine | Admitting: Family Medicine

## 2017-03-10 DIAGNOSIS — Z1231 Encounter for screening mammogram for malignant neoplasm of breast: Secondary | ICD-10-CM

## 2018-02-01 ENCOUNTER — Other Ambulatory Visit: Payer: Self-pay | Admitting: Family Medicine

## 2018-02-01 DIAGNOSIS — Z1231 Encounter for screening mammogram for malignant neoplasm of breast: Secondary | ICD-10-CM

## 2018-03-14 ENCOUNTER — Ambulatory Visit
Admission: RE | Admit: 2018-03-14 | Discharge: 2018-03-14 | Disposition: A | Discharge status: Home / Self Care | Payer: BC Managed Care – PPO | Source: Ambulatory Visit | Attending: Family Medicine | Admitting: Family Medicine

## 2018-03-14 DIAGNOSIS — Z1231 Encounter for screening mammogram for malignant neoplasm of breast: Secondary | ICD-10-CM

## 2019-02-16 ENCOUNTER — Other Ambulatory Visit: Payer: Self-pay | Admitting: Family Medicine

## 2019-02-16 DIAGNOSIS — Z1231 Encounter for screening mammogram for malignant neoplasm of breast: Secondary | ICD-10-CM

## 2019-04-03 ENCOUNTER — Other Ambulatory Visit: Payer: Self-pay

## 2019-04-03 ENCOUNTER — Ambulatory Visit
Admission: RE | Admit: 2019-04-03 | Discharge: 2019-04-03 | Disposition: A | Discharge status: Home / Self Care | Payer: BC Managed Care – PPO | Source: Ambulatory Visit | Attending: Family Medicine | Admitting: Family Medicine

## 2019-04-03 DIAGNOSIS — Z1231 Encounter for screening mammogram for malignant neoplasm of breast: Secondary | ICD-10-CM

## 2019-07-04 ENCOUNTER — Other Ambulatory Visit: Payer: Self-pay

## 2019-07-04 DIAGNOSIS — Z20822 Contact with and (suspected) exposure to covid-19: Secondary | ICD-10-CM

## 2019-07-06 LAB — NOVEL CORONAVIRUS, NAA: SARS-CoV-2, NAA: NOT DETECTED

## 2019-10-07 ENCOUNTER — Ambulatory Visit: Payer: BC Managed Care – PPO | Attending: Internal Medicine

## 2019-10-07 DIAGNOSIS — Z23 Encounter for immunization: Secondary | ICD-10-CM

## 2019-10-07 NOTE — Progress Notes (Signed)
   Covid-19 Vaccination Clinic  Name:  [name]    MRN: [id number] DOB: 02/15/1966  10/07/2019  [name] was observed post Covid-19 immunization for 15 minutes without incidence. She was provided with Vaccine Information Sheet and instruction to access the V-Safe system.   [name] was instructed to call 911 with any severe reactions post vaccine: Marland Kitchen Difficulty breathing  . Swelling of your face and throat  . A fast heartbeat  . A bad rash all over your body  . Dizziness and weakness    Immunizations Administered    Name Date Dose VIS Date Route   Pfizer COVID-19 Vaccine 10/07/2019 12:21 PM 0.3 mL 07/21/2019 Intramuscular   Manufacturer: [place]   Lot: [id number]   [place]: [id number]

## 2019-10-28 ENCOUNTER — Ambulatory Visit: Payer: BC Managed Care – PPO | Attending: Internal Medicine

## 2019-10-28 DIAGNOSIS — Z23 Encounter for immunization: Secondary | ICD-10-CM

## 2019-10-28 NOTE — Progress Notes (Signed)
   Covid-19 Vaccination Clinic  Name:  [name]    MRN: [id number] DOB: December 02, 1965  10/28/2019  [name] was observed post Covid-19 immunization for 15 minutes without incident. She was provided with Vaccine Information Sheet and instruction to access the V-Safe system.   [name] was instructed to call 911 with any severe reactions post vaccine: Marland Kitchen Difficulty breathing  . Swelling of face and throat  . A fast heartbeat  . A bad rash all over body  . Dizziness and weakness   Immunizations Administered    Name Date Dose VIS Date Route   Pfizer COVID-19 Vaccine 10/28/2019  8:51 AM 0.3 mL 07/21/2019 Intramuscular   Manufacturer: [place]   Lot: [id number]   [place]: [id number]

## 2019-11-01 ENCOUNTER — Ambulatory Visit: Payer: BC Managed Care – PPO

## 2020-02-20 ENCOUNTER — Other Ambulatory Visit: Payer: Self-pay | Admitting: Obstetrics and Gynecology

## 2020-02-20 DIAGNOSIS — Z1231 Encounter for screening mammogram for malignant neoplasm of breast: Secondary | ICD-10-CM

## 2020-04-05 ENCOUNTER — Other Ambulatory Visit: Payer: Self-pay

## 2020-04-05 ENCOUNTER — Ambulatory Visit
Admission: RE | Admit: 2020-04-05 | Discharge: 2020-04-05 | Disposition: A | Discharge status: Home / Self Care | Payer: BC Managed Care – PPO | Source: Ambulatory Visit | Attending: Obstetrics and Gynecology | Admitting: Obstetrics and Gynecology

## 2020-04-05 DIAGNOSIS — Z1231 Encounter for screening mammogram for malignant neoplasm of breast: Secondary | ICD-10-CM

## 2020-04-10 ENCOUNTER — Other Ambulatory Visit: Payer: Self-pay | Admitting: Obstetrics and Gynecology

## 2020-04-10 DIAGNOSIS — R928 Other abnormal and inconclusive findings on diagnostic imaging of breast: Secondary | ICD-10-CM

## 2020-04-18 ENCOUNTER — Other Ambulatory Visit: Payer: Self-pay

## 2020-04-18 ENCOUNTER — Ambulatory Visit
Admission: RE | Admit: 2020-04-18 | Discharge: 2020-04-18 | Disposition: A | Discharge status: Home / Self Care | Payer: BC Managed Care – PPO | Source: Ambulatory Visit | Attending: Obstetrics and Gynecology | Admitting: Obstetrics and Gynecology

## 2020-04-18 DIAGNOSIS — R928 Other abnormal and inconclusive findings on diagnostic imaging of breast: Secondary | ICD-10-CM

## 2020-04-23 ENCOUNTER — Other Ambulatory Visit: Payer: Self-pay | Admitting: Obstetrics and Gynecology

## 2020-04-23 DIAGNOSIS — N63 Unspecified lump in unspecified breast: Secondary | ICD-10-CM

## 2020-04-25 ENCOUNTER — Other Ambulatory Visit: Payer: BC Managed Care – PPO

## 2020-08-16 ENCOUNTER — Other Ambulatory Visit: Payer: Self-pay

## 2020-08-16 DIAGNOSIS — Z20822 Contact with and (suspected) exposure to covid-19: Secondary | ICD-10-CM

## 2020-08-19 LAB — NOVEL CORONAVIRUS, NAA: SARS-CoV-2, NAA: DETECTED — AB

## 2020-08-20 ENCOUNTER — Other Ambulatory Visit: Payer: Self-pay | Admitting: Family Medicine

## 2020-10-17 ENCOUNTER — Other Ambulatory Visit: Payer: Self-pay

## 2020-10-17 ENCOUNTER — Ambulatory Visit
Admission: RE | Admit: 2020-10-17 | Discharge: 2020-10-17 | Disposition: A | Discharge status: Home / Self Care | Payer: BC Managed Care – PPO | Source: Ambulatory Visit | Attending: Obstetrics and Gynecology | Admitting: Obstetrics and Gynecology

## 2020-10-17 ENCOUNTER — Ambulatory Visit: Payer: BC Managed Care – PPO

## 2020-10-17 DIAGNOSIS — N63 Unspecified lump in unspecified breast: Secondary | ICD-10-CM

## 2021-03-20 IMAGING — MG MM DIGITAL DIAGNOSTIC UNILAT*R* W/ TOMO W/ CAD
4 series · 4 of 12 positions shown · non-contrast
Comparison: Previous exam(s).

CLINICAL DATA: Patient for short-term follow-up of right breast
mass.

EXAM:
DIGITAL DIAGNOSTIC UNILATERAL RIGHT MAMMOGRAM WITH TOMOSYNTHESIS AND
CAD
TECHNIQUE: Right digital diagnostic mammography and breast tomosynthesis was
performed. The images were evaluated with computer-aided detection.

[R MLO synth-2D]
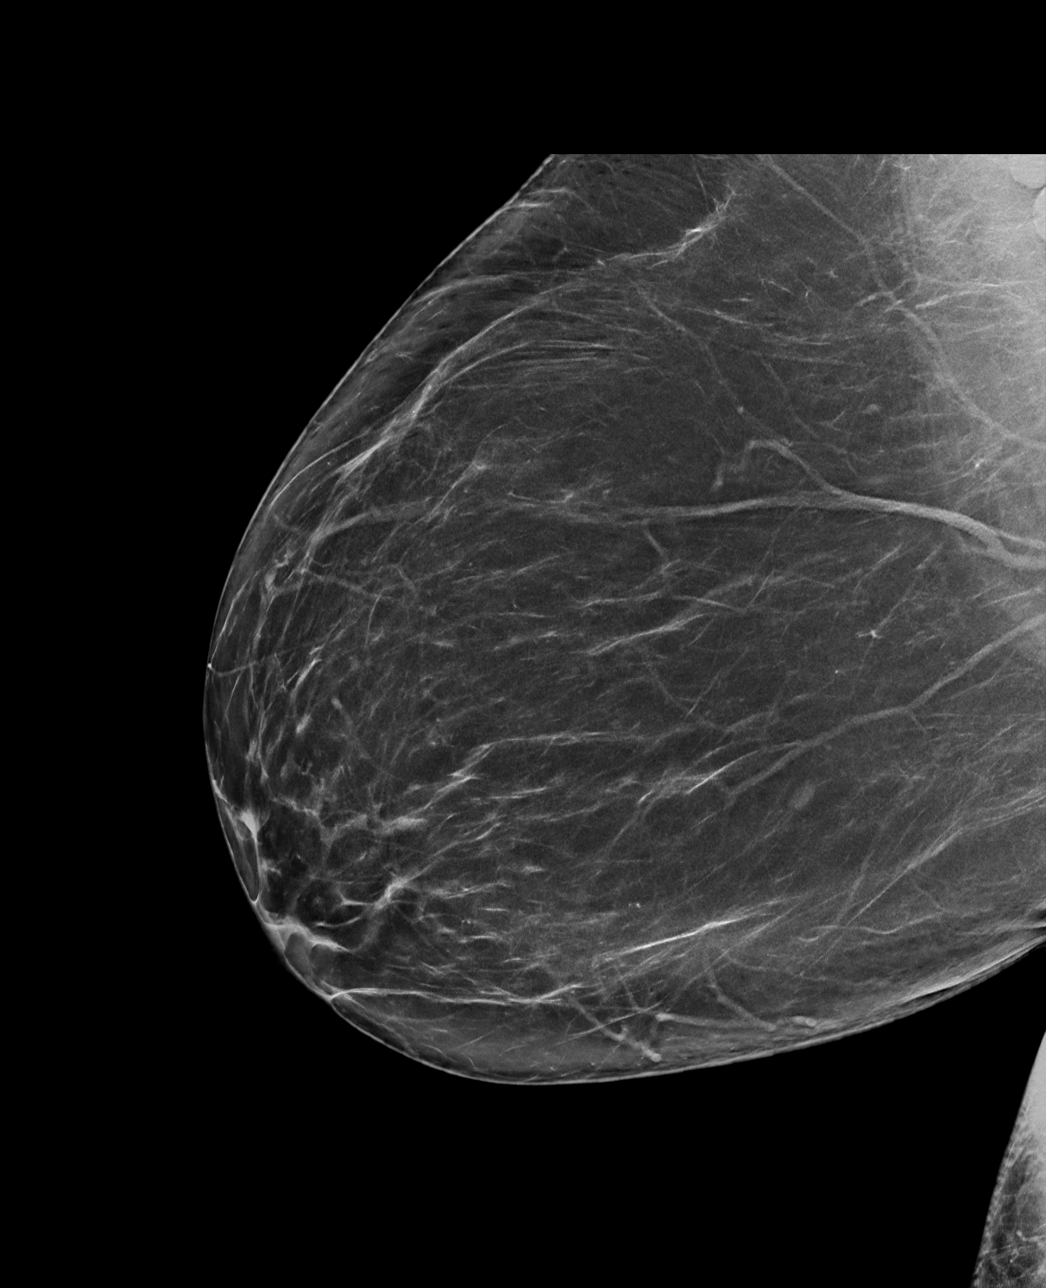

[R CC synth-2D]
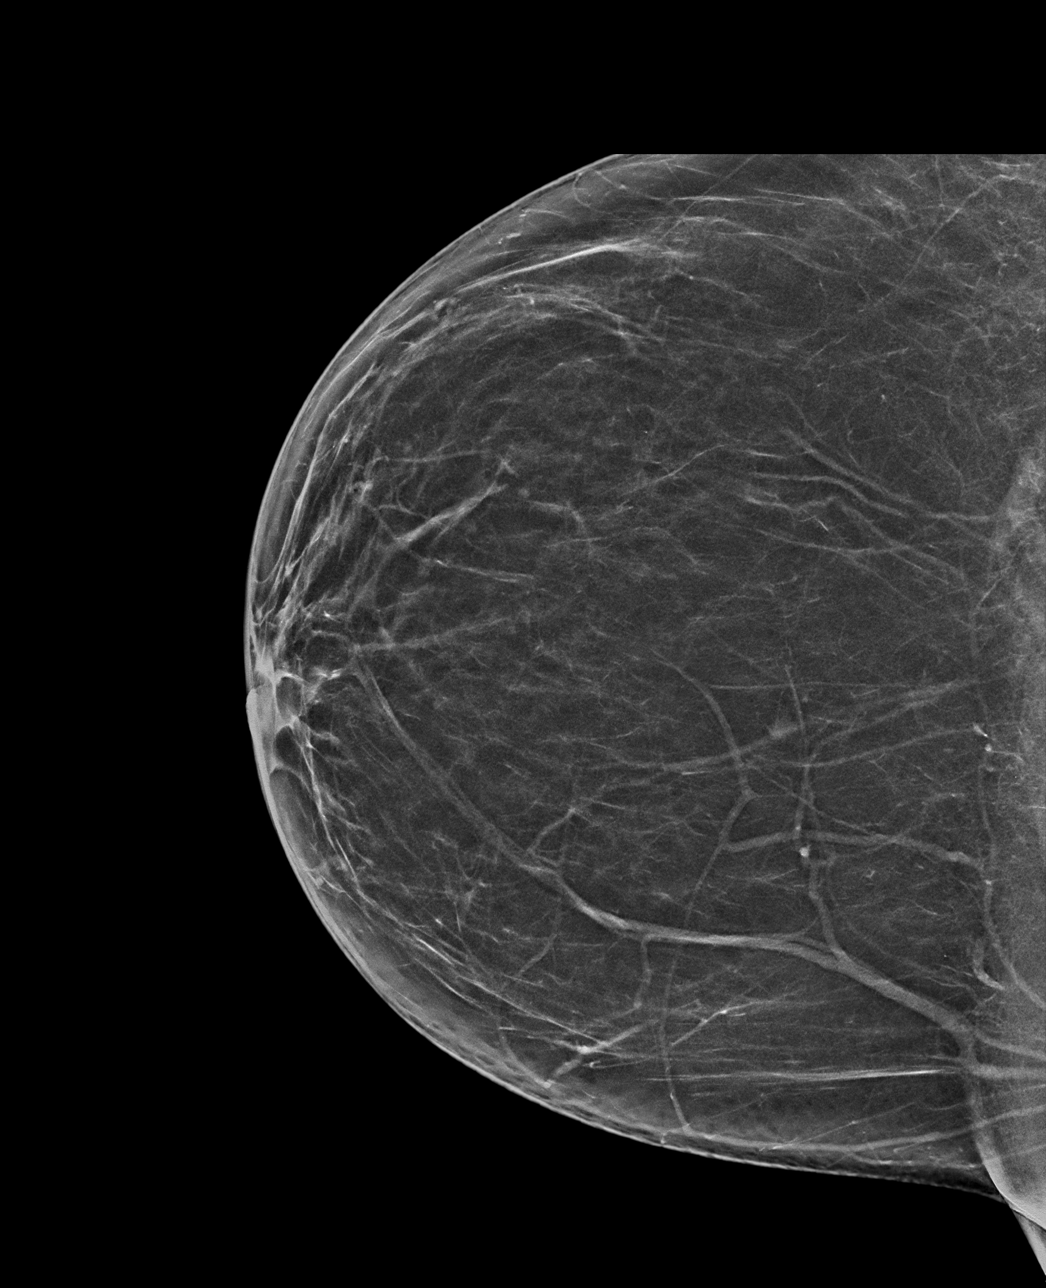

[R MLO tomo · tomo slice 41/82.0]
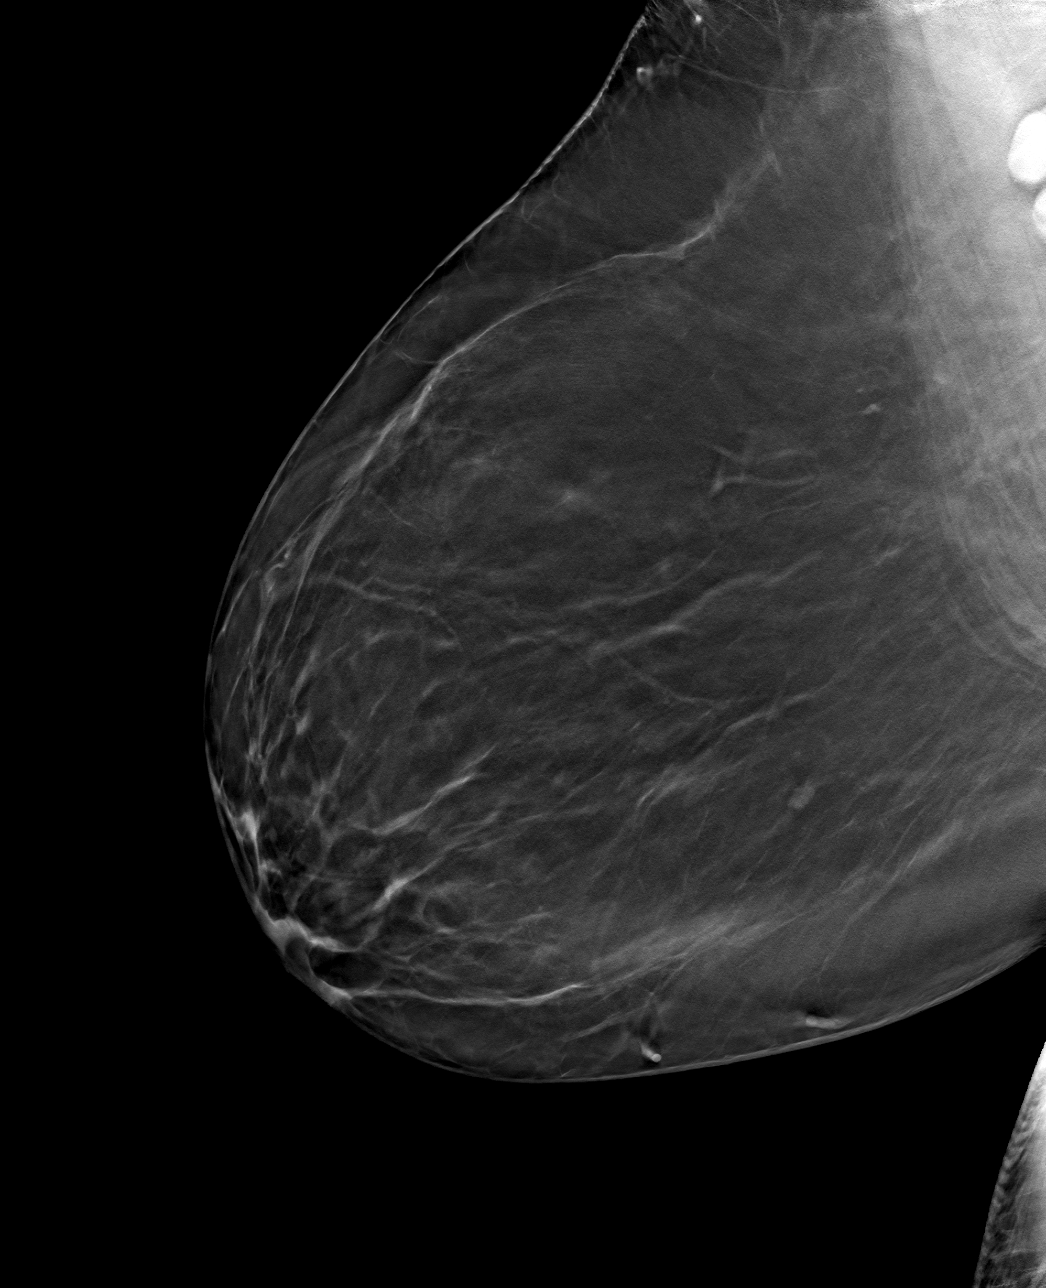

[R CC tomo · tomo slice 35/68.0]
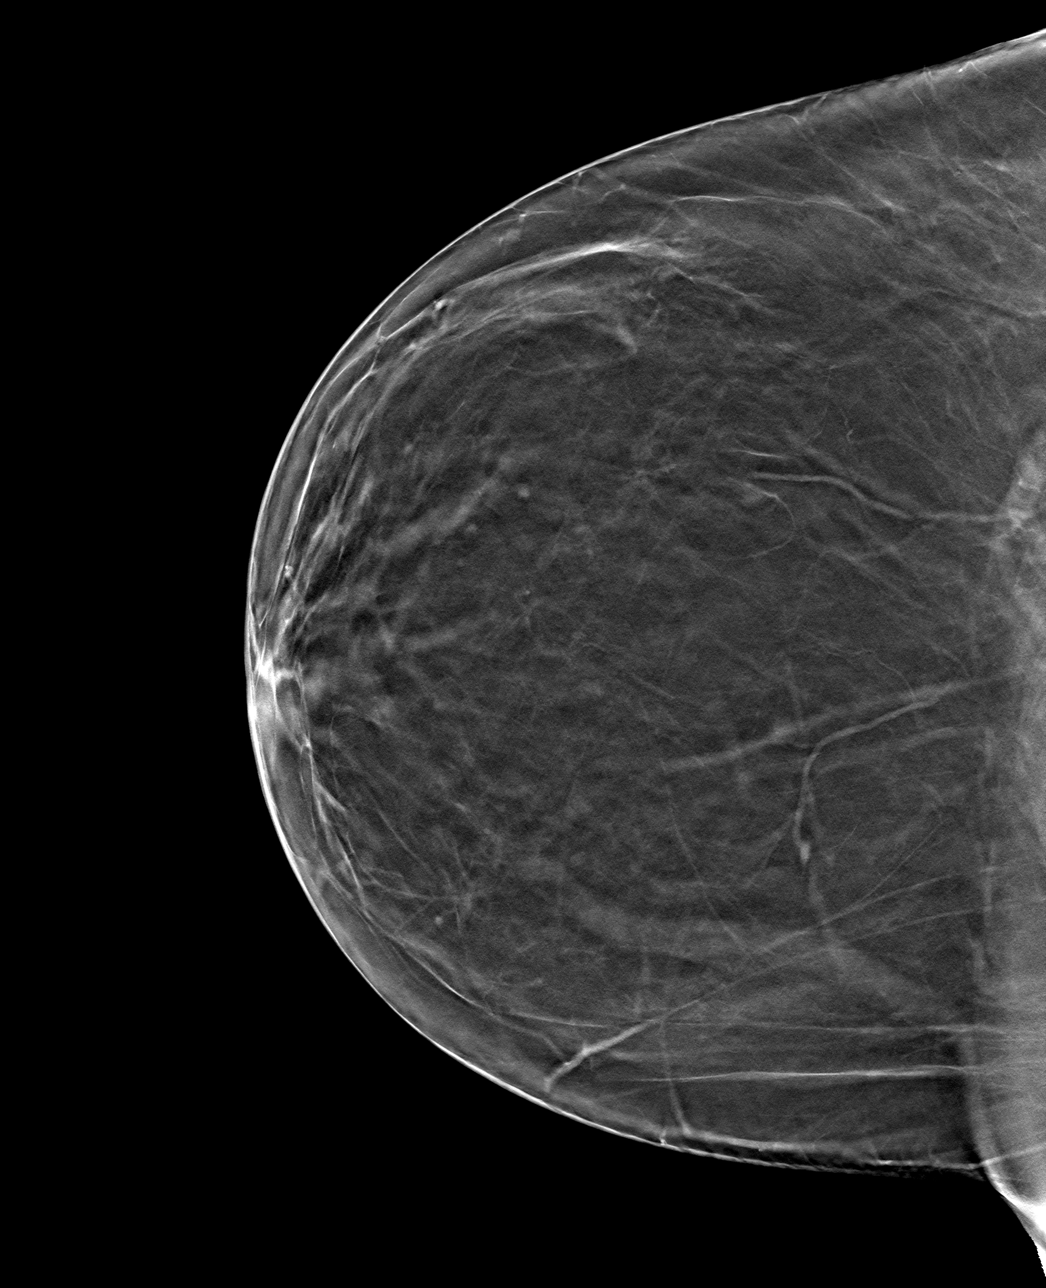

[4 of 12 positions shown; findings below may reference images not displayed]

ACR Breast Density Category b: There are scattered areas of
fibroglandular density.
FINDINGS: Previously visualized mass within the right breast on mammography
has resolved. No suspicious findings on today's imaging.
IMPRESSION: No mammographic evidence for malignancy. Interval resolution right
breast mass.

RECOMMENDATION:
Return to annual screening mammography April 2021.

I have discussed the findings and recommendations with the patient.
If applicable, a reminder letter will be sent to the patient
regarding the next appointment.

BI-RADS CATEGORY  2: Benign.

## 2021-06-10 ENCOUNTER — Other Ambulatory Visit: Payer: Self-pay | Admitting: Obstetrics and Gynecology

## 2021-06-10 DIAGNOSIS — Z1231 Encounter for screening mammogram for malignant neoplasm of breast: Secondary | ICD-10-CM

## 2021-07-28 ENCOUNTER — Ambulatory Visit
Admission: RE | Admit: 2021-07-28 | Discharge: 2021-07-28 | Disposition: A | Discharge status: Home / Self Care | Payer: BC Managed Care – PPO | Source: Ambulatory Visit | Attending: Obstetrics and Gynecology | Admitting: Obstetrics and Gynecology

## 2021-07-28 DIAGNOSIS — Z1231 Encounter for screening mammogram for malignant neoplasm of breast: Secondary | ICD-10-CM

## 2022-07-16 ENCOUNTER — Ambulatory Visit
Admission: RE | Admit: 2022-07-16 | Discharge: 2022-07-16 | Disposition: A | Discharge status: Home / Self Care | Payer: BC Managed Care – PPO | Source: Ambulatory Visit | Attending: Family Medicine | Admitting: Family Medicine

## 2022-07-16 ENCOUNTER — Other Ambulatory Visit: Payer: Self-pay | Admitting: Family Medicine

## 2022-07-16 DIAGNOSIS — R059 Cough, unspecified: Secondary | ICD-10-CM

## 2023-08-10 ENCOUNTER — Other Ambulatory Visit: Payer: Self-pay | Admitting: Obstetrics and Gynecology

## 2023-08-10 DIAGNOSIS — Z1231 Encounter for screening mammogram for malignant neoplasm of breast: Secondary | ICD-10-CM

## 2023-08-24 ENCOUNTER — Ambulatory Visit
Admission: RE | Admit: 2023-08-24 | Discharge: 2023-08-24 | Disposition: A | Discharge status: Home / Self Care | Payer: 59 | Source: Ambulatory Visit | Attending: Obstetrics and Gynecology | Admitting: Obstetrics and Gynecology

## 2023-08-24 DIAGNOSIS — Z1231 Encounter for screening mammogram for malignant neoplasm of breast: Secondary | ICD-10-CM

## 2023-10-12 ENCOUNTER — Other Ambulatory Visit: Payer: Self-pay | Admitting: Family Medicine

## 2023-10-12 ENCOUNTER — Encounter: Payer: Self-pay | Admitting: Family Medicine

## 2023-10-12 DIAGNOSIS — R052 Subacute cough: Secondary | ICD-10-CM

## 2023-10-13 ENCOUNTER — Encounter: Payer: Self-pay | Admitting: Family Medicine

## 2023-10-14 ENCOUNTER — Ambulatory Visit
Admission: RE | Admit: 2023-10-14 | Discharge: 2023-10-14 | Disposition: A | Discharge status: Home / Self Care | Source: Ambulatory Visit | Attending: Family Medicine | Admitting: Family Medicine

## 2023-10-14 DIAGNOSIS — R052 Subacute cough: Secondary | ICD-10-CM

## 2023-10-14 MED ORDER — IOPAMIDOL (ISOVUE-300) INJECTION 61%
75.0000 mL | Freq: Once | INTRAVENOUS | Status: AC | PRN
  Administered 2023-10-14: 75 mL via INTRAVENOUS

## 2024-06-28 ENCOUNTER — Other Ambulatory Visit: Payer: Self-pay | Admitting: Obstetrics and Gynecology

## 2024-06-28 DIAGNOSIS — Z1231 Encounter for screening mammogram for malignant neoplasm of breast: Secondary | ICD-10-CM

## 2024-08-24 ENCOUNTER — Ambulatory Visit
Admission: RE | Admit: 2024-08-24 | Discharge: 2024-08-24 | Disposition: A | Source: Ambulatory Visit | Attending: Obstetrics and Gynecology | Admitting: Obstetrics and Gynecology

## 2024-08-24 DIAGNOSIS — Z1231 Encounter for screening mammogram for malignant neoplasm of breast: Secondary | ICD-10-CM
